# Patient Record
Sex: Male | Born: 1980 | Race: White | Hispanic: No | Marital: Married | Smoking: Never smoker
Health system: Southern US, Community
[De-identification: ages and names within clinical notes are randomized; demographics above are authoritative.]

## PROBLEM LIST (undated history)

## (undated) DIAGNOSIS — L723 Sebaceous cyst: Secondary | ICD-10-CM

## (undated) DIAGNOSIS — J029 Acute pharyngitis, unspecified: Secondary | ICD-10-CM

## (undated) DIAGNOSIS — R0981 Nasal congestion: Secondary | ICD-10-CM

## (undated) HISTORY — DX: Nasal congestion: R09.81

## (undated) HISTORY — DX: Sebaceous cyst: L72.3

## (undated) HISTORY — DX: Acute pharyngitis, unspecified: J02.9

---

## 2007-08-08 ENCOUNTER — Emergency Department (HOSPITAL_COMMUNITY): Admission: EM | Admit: 2007-08-08 | Discharge: 2007-08-08 | Payer: Self-pay | Admitting: Emergency Medicine

## 2007-08-16 ENCOUNTER — Encounter: Admission: RE | Admit: 2007-08-16 | Discharge: 2007-08-16 | Payer: Self-pay | Admitting: Family Medicine

## 2011-05-22 LAB — CBC
HCT: 41.9
Hemoglobin: 15.1
MCHC: 36
MCV: 88.3
Platelets: 292
RBC: 4.74
RDW: 11.6
WBC: 5.3

## 2011-05-22 LAB — BASIC METABOLIC PANEL
BUN: 12
CO2: 30
Calcium: 10
Chloride: 105
Creatinine, Ser: 0.74
GFR calc Af Amer: >60
GFR calc non Af Amer: >60
Glucose, Bld: 112 — ABNORMAL HIGH
Potassium: 4.2
Sodium: 141

## 2011-05-22 LAB — CLOSTRIDIUM DIFFICILE EIA

## 2011-05-22 LAB — OVA AND PARASITE EXAMINATION

## 2011-05-22 LAB — DIFFERENTIAL
Basophils Absolute: 0
Basophils Relative: 0
Eosinophils Absolute: 0.1 — ABNORMAL LOW
Eosinophils Relative: 1
Lymphocytes Relative: 32
Lymphs Abs: 1.7
Monocytes Absolute: 0.7
Monocytes Relative: 13 — ABNORMAL HIGH
Neutro Abs: 2.9
Neutrophils Relative %: 54

## 2011-05-22 LAB — STOOL CULTURE

## 2011-05-22 LAB — FECAL LACTOFERRIN, QUANT: Fecal Lactoferrin: NEGATIVE

## 2011-12-04 ENCOUNTER — Ambulatory Visit (INDEPENDENT_AMBULATORY_CARE_PROVIDER_SITE_OTHER): Payer: Self-pay | Admitting: General Surgery

## 2011-12-21 ENCOUNTER — Ambulatory Visit (INDEPENDENT_AMBULATORY_CARE_PROVIDER_SITE_OTHER): Payer: BC Managed Care – PPO | Admitting: General Surgery

## 2011-12-21 ENCOUNTER — Encounter (INDEPENDENT_AMBULATORY_CARE_PROVIDER_SITE_OTHER): Payer: Self-pay | Admitting: General Surgery

## 2011-12-21 VITALS — BP 136/82 | HR 70 | Temp 97.6°F | Resp 16 | Ht 68.0 in | Wt 179.5 lb

## 2011-12-21 DIAGNOSIS — L723 Sebaceous cyst: Secondary | ICD-10-CM

## 2011-12-21 NOTE — Progress Notes (Signed)
Patient ID: Anthony Mccullough, male   DOB: 1981-05-31, 31 y.o.   MRN: [id number]  Chief Complaint  Patient presents with  . Cyst    Sebaceous - on neck    HPI Anthony Mccullough is a 31 y.o. male.   HPI 8 yom with a 6 year history of an asymptomatic posterior neck mass.  He states he is here because his wife made him come look at it.  He has never had a history of infection, drainage or any other problems.    Past Medical History  Diagnosis Date  . Sebaceous cyst     back of neck  . Nasal congestion   . Sore throat     History reviewed. No pertinent past surgical history.  Family History  Problem Relation Age of Onset  . Cancer Maternal Grandfather     prostate    Social History History  Substance Use Topics  . Smoking status: Never Smoker   . Smokeless tobacco: Never Used  . Alcohol Use: Yes     1 or 2 glasses of wine per night    Allergies  Allergen Reactions  . Penicillins Rash    No current outpatient prescriptions on file.    Review of Systems Review of Systems  Constitutional: Negative for fever, chills and unexpected weight change.  HENT: Positive for congestion and sore throat. Negative for hearing loss, trouble swallowing and voice change.   Eyes: Negative for visual disturbance.  Respiratory: Negative for cough and wheezing.   Cardiovascular: Negative for chest pain, palpitations and leg swelling.  Gastrointestinal: Negative for nausea, vomiting, abdominal pain, diarrhea, constipation, blood in stool, abdominal distention, anal bleeding and rectal pain.  Genitourinary: Negative for hematuria and difficulty urinating.  Musculoskeletal: Negative for arthralgias.  Skin: Negative for rash and wound.  Neurological: Negative for seizures, syncope, weakness and headaches.  Hematological: Negative for adenopathy. Does not bruise/bleed easily.  Psychiatric/Behavioral: Negative for confusion.    Blood pressure 136/82, pulse 70, temperature 97.6 F (36.4 C),  temperature source Temporal, resp. rate 16, height 5\' 8"  (1.727 m), weight 179 lb 8 oz (81.421 kg).  Physical Exam Physical Exam  Vitals reviewed. Constitutional: He appears well-developed and well-nourished.  Neck:       Assessment    Posterior neck sebaceous cyst     Plan    We discussed observation vs removing this.  This certainly looks like a sebaceous cyst and has never had any symptoms.  He is not sure what he wants to do right now.  He will call back when he decides.  We discussed excising this as well as risks and benefits.        Anthony Mccullough 12/21/2011, 4:00 PM

## 2012-03-04 ENCOUNTER — Inpatient Hospital Stay (HOSPITAL_COMMUNITY)
Admission: EM | Admit: 2012-03-04 | Discharge: 2012-03-05 | DRG: 883 | Disposition: A | Discharge status: Home / Self Care | Payer: BC Managed Care – PPO | Attending: General Surgery | Admitting: General Surgery

## 2012-03-04 ENCOUNTER — Emergency Department (HOSPITAL_BASED_OUTPATIENT_CLINIC_OR_DEPARTMENT_OTHER): Payer: BC Managed Care – PPO

## 2012-03-04 ENCOUNTER — Encounter (HOSPITAL_BASED_OUTPATIENT_CLINIC_OR_DEPARTMENT_OTHER): Payer: Self-pay | Admitting: *Deleted

## 2012-03-04 ENCOUNTER — Encounter (HOSPITAL_COMMUNITY): Payer: Self-pay | Admitting: Anesthesiology

## 2012-03-04 ENCOUNTER — Emergency Department (HOSPITAL_COMMUNITY): Payer: BC Managed Care – PPO | Admitting: Anesthesiology

## 2012-03-04 ENCOUNTER — Encounter (HOSPITAL_COMMUNITY): Payer: Self-pay | Admitting: Emergency Medicine

## 2012-03-04 ENCOUNTER — Encounter (HOSPITAL_COMMUNITY): Admission: EM | Disposition: A | Discharge status: Home / Self Care | Payer: Self-pay | Source: Home / Self Care

## 2012-03-04 ENCOUNTER — Emergency Department (HOSPITAL_BASED_OUTPATIENT_CLINIC_OR_DEPARTMENT_OTHER)
Admission: EM | Admit: 2012-03-04 | Discharge: 2012-03-04 | Disposition: A | Discharge status: Home / Self Care | Payer: BC Managed Care – PPO | Source: Home / Self Care | Attending: Emergency Medicine | Admitting: Emergency Medicine

## 2012-03-04 DIAGNOSIS — K3580 Unspecified acute appendicitis: Secondary | ICD-10-CM

## 2012-03-04 DIAGNOSIS — K37 Unspecified appendicitis: Secondary | ICD-10-CM

## 2012-03-04 HISTORY — PX: LAPAROSCOPIC APPENDECTOMY: SHX408

## 2012-03-04 LAB — COMPREHENSIVE METABOLIC PANEL
BUN: 9 mg/dL (ref 6–23)
Calcium: 9.8 mg/dL (ref 8.4–10.5)
Chloride: 103 mEq/L (ref 96–112)
Creatinine, Ser: 0.7 mg/dL (ref 0.50–1.35)
GFR calc Af Amer: >90 mL/min (ref >90)
GFR calc non Af Amer: >90 mL/min (ref >90)
Sodium: 141 mEq/L (ref 135–145)
Total Bilirubin: 2.3 mg/dL — ABNORMAL HIGH (ref 0.3–1.2)
Total Protein: 7.6 g/dL (ref 6.0–8.3)

## 2012-03-04 LAB — CBC WITH DIFFERENTIAL/PLATELET
Eosinophils Relative: 0 % (ref 0–5)
Hemoglobin: 15.5 g/dL (ref 13.0–17.0)
Lymphocytes Relative: 17 % (ref 12–46)
Lymphs Abs: 1.5 10*3/uL (ref 0.7–4.0)
MCHC: 36.6 g/dL — ABNORMAL HIGH (ref 30.0–36.0)
Monocytes Absolute: 0.6 10*3/uL (ref 0.1–1.0)
Platelets: 203 10*3/uL (ref 150–400)
RBC: 4.96 MIL/uL (ref 4.22–5.81)
RDW: 11.7 % (ref 11.5–15.5)
WBC: 8.5 10*3/uL (ref 4.0–10.5)

## 2012-03-04 LAB — URINALYSIS, ROUTINE W REFLEX MICROSCOPIC
Glucose, UA: NEGATIVE mg/dL
Ketones, ur: NEGATIVE mg/dL
Leukocytes, UA: NEGATIVE
Nitrite: NEGATIVE
Protein, ur: NEGATIVE mg/dL
Specific Gravity, Urine: 1.015 (ref 1.005–1.030)
pH: 6.5 (ref 5.0–8.0)

## 2012-03-04 SURGERY — APPENDECTOMY, LAPAROSCOPIC
Anesthesia: General | Site: Abdomen | Wound class: Contaminated

## 2012-03-04 MED ORDER — ONDANSETRON HCL 4 MG/2ML IJ SOLN: 4.0000 mg | Freq: Once | INTRAMUSCULAR | Status: DC

## 2012-03-04 MED ORDER — DIPHENHYDRAMINE HCL 50 MG/ML IJ SOLN: 12.5000 mg | Freq: Four times a day (QID) | INTRAMUSCULAR | Status: DC | PRN

## 2012-03-04 MED ORDER — ONDANSETRON HCL 4 MG/2ML IJ SOLN
INTRAMUSCULAR | Status: DC | PRN
  Administered 2012-03-04: 4 mg via INTRAVENOUS

## 2012-03-04 MED ORDER — BUPIVACAINE-EPINEPHRINE 0.25% -1:200000 IJ SOLN
INTRAMUSCULAR | Status: AC
  Filled 2012-03-04: qty 1

## 2012-03-04 MED ORDER — LIDOCAINE HCL (CARDIAC) 20 MG/ML IV SOLN
INTRAVENOUS | Status: DC | PRN
  Administered 2012-03-04: 100 mg via INTRAVENOUS

## 2012-03-04 MED ORDER — NAPROXEN 500 MG PO TABS: 500.0000 mg | ORAL_TABLET | Freq: Two times a day (BID) | ORAL | Status: DC

## 2012-03-04 MED ORDER — NEOSTIGMINE METHYLSULFATE 1 MG/ML IJ SOLN
INTRAMUSCULAR | Status: DC | PRN
  Administered 2012-03-04: 4 mg via INTRAVENOUS

## 2012-03-04 MED ORDER — CIPROFLOXACIN IN D5W 400 MG/200ML IV SOLN
400.0000 mg | Freq: Two times a day (BID) | INTRAVENOUS | Status: DC
  Filled 2012-03-04: qty 200

## 2012-03-04 MED ORDER — CISATRACURIUM BESYLATE (PF) 10 MG/5ML IV SOLN
INTRAVENOUS | Status: DC | PRN
  Administered 2012-03-04: 4 mg via INTRAVENOUS

## 2012-03-04 MED ORDER — FENTANYL CITRATE 0.05 MG/ML IJ SOLN
INTRAMUSCULAR | Status: DC | PRN
  Administered 2012-03-04: 50 ug via INTRAVENOUS
  Administered 2012-03-04: 150 ug via INTRAVENOUS

## 2012-03-04 MED ORDER — PROMETHAZINE HCL 25 MG/ML IJ SOLN
12.5000 mg | Freq: Four times a day (QID) | INTRAMUSCULAR | Status: DC | PRN
  Filled 2012-03-04: qty 1

## 2012-03-04 MED ORDER — LACTATED RINGERS IV SOLN
INTRAVENOUS | Status: DC | PRN
  Administered 2012-03-04 (×2): via INTRAVENOUS

## 2012-03-04 MED ORDER — DEXAMETHASONE SODIUM PHOSPHATE 4 MG/ML IJ SOLN
INTRAMUSCULAR | Status: DC | PRN
  Administered 2012-03-04: 10 mg via INTRAVENOUS

## 2012-03-04 MED ORDER — ACETAMINOPHEN 325 MG PO TABS: 650.0000 mg | ORAL_TABLET | Freq: Four times a day (QID) | ORAL | Status: DC

## 2012-03-04 MED ORDER — GLYCOPYRROLATE 0.2 MG/ML IJ SOLN
INTRAMUSCULAR | Status: DC | PRN
  Administered 2012-03-04: .6 mg via INTRAVENOUS

## 2012-03-04 MED ORDER — KETOROLAC TROMETHAMINE 30 MG/ML IJ SOLN
INTRAMUSCULAR | Status: DC | PRN
  Administered 2012-03-04: 30 mg via INTRAVENOUS

## 2012-03-04 MED ORDER — METRONIDAZOLE IN NACL 5-0.79 MG/ML-% IV SOLN
INTRAVENOUS | Status: AC
  Filled 2012-03-04: qty 100

## 2012-03-04 MED ORDER — ONDANSETRON HCL 4 MG/2ML IJ SOLN: 4.0000 mg | Freq: Four times a day (QID) | INTRAMUSCULAR | Status: DC | PRN

## 2012-03-04 MED ORDER — ACETAMINOPHEN 10 MG/ML IV SOLN
INTRAVENOUS | Status: AC
  Filled 2012-03-04: qty 100

## 2012-03-04 MED ORDER — MAGIC MOUTHWASH
15.0000 mL | Freq: Four times a day (QID) | ORAL | Status: DC | PRN
  Filled 2012-03-04: qty 15

## 2012-03-04 MED ORDER — SUCCINYLCHOLINE CHLORIDE 20 MG/ML IJ SOLN
INTRAMUSCULAR | Status: DC | PRN
  Administered 2012-03-04: 80 mg via INTRAVENOUS

## 2012-03-04 MED ORDER — PROPOFOL 10 MG/ML IV EMUL
INTRAVENOUS | Status: DC | PRN
  Administered 2012-03-04: 100 mg via INTRAVENOUS
  Administered 2012-03-04: 180 mg via INTRAVENOUS

## 2012-03-04 MED ORDER — ACETAMINOPHEN 10 MG/ML IV SOLN
INTRAVENOUS | Status: DC | PRN
  Administered 2012-03-04: 1000 mg via INTRAVENOUS

## 2012-03-04 MED ORDER — CHLORHEXIDINE GLUCONATE 4 % EX LIQD
1.0000 "application " | Freq: Once | CUTANEOUS | Status: DC
  Filled 2012-03-04: qty 15

## 2012-03-04 MED ORDER — SODIUM CHLORIDE 0.9 % IV BOLUS (SEPSIS)
1000.0000 mL | Freq: Once | INTRAVENOUS | Status: AC
  Administered 2012-03-04: 1000 mL via INTRAVENOUS

## 2012-03-04 MED ORDER — LACTATED RINGERS IV BOLUS (SEPSIS): 1000.0000 mL | Freq: Three times a day (TID) | INTRAVENOUS | Status: DC | PRN

## 2012-03-04 MED ORDER — CIPROFLOXACIN IN D5W 400 MG/200ML IV SOLN
INTRAVENOUS | Status: DC | PRN
  Administered 2012-03-04: 400 mg via INTRAVENOUS

## 2012-03-04 MED ORDER — LACTATED RINGERS IR SOLN
Status: DC | PRN
  Administered 2012-03-04: 3000 mL

## 2012-03-04 MED ORDER — CHLORHEXIDINE GLUCONATE 4 % EX LIQD: 1.0000 "application " | Freq: Once | CUTANEOUS | Status: DC

## 2012-03-04 MED ORDER — METRONIDAZOLE IN NACL 5-0.79 MG/ML-% IV SOLN
500.0000 mg | Freq: Four times a day (QID) | INTRAVENOUS | Status: DC
  Administered 2012-03-04: .5 g via INTRAVENOUS

## 2012-03-04 MED ORDER — BUPIVACAINE-EPINEPHRINE 0.25% -1:200000 IJ SOLN
INTRAMUSCULAR | Status: DC | PRN
  Administered 2012-03-04: 10 mL
  Administered 2012-03-04: 50 mL

## 2012-03-04 MED ORDER — HYDROMORPHONE HCL PF 1 MG/ML IJ SOLN
0.5000 mg | INTRAMUSCULAR | Status: DC | PRN
  Filled 2012-03-04: qty 1

## 2012-03-04 MED ORDER — HYDROMORPHONE HCL PF 1 MG/ML IJ SOLN: 1.0000 mg | Freq: Once | INTRAMUSCULAR | Status: DC

## 2012-03-04 MED ORDER — ALUM & MAG HYDROXIDE-SIMETH 200-200-20 MG/5ML PO SUSP: 30.0000 mL | Freq: Four times a day (QID) | ORAL | Status: DC | PRN

## 2012-03-04 MED ORDER — CIPROFLOXACIN IN D5W 400 MG/200ML IV SOLN
INTRAVENOUS | Status: AC
  Filled 2012-03-04: qty 200

## 2012-03-04 MED ORDER — 0.9 % SODIUM CHLORIDE (POUR BTL) OPTIME
TOPICAL | Status: DC | PRN
  Administered 2012-03-04: 1000 mL

## 2012-03-04 SURGICAL SUPPLY — 45 items
APPLIER CLIP 5 13 M/L LIGAMAX5 (MISCELLANEOUS)
APPLIER CLIP ROT 10 11.4 M/L (STAPLE)
CANISTER SUCTION 2500CC (MISCELLANEOUS) ×2 IMPLANT
CLIP APPLIE 5 13 M/L LIGAMAX5 (MISCELLANEOUS) IMPLANT
CLIP APPLIE ROT 10 11.4 M/L (STAPLE) IMPLANT
CLOTH BEACON ORANGE TIMEOUT ST (SAFETY) ×2 IMPLANT
CUTTER FLEX LINEAR 45M (STAPLE) ×2 IMPLANT
DECANTER SPIKE VIAL GLASS SM (MISCELLANEOUS) ×2 IMPLANT
DRAPE LAPAROSCOPIC ABDOMINAL (DRAPES) ×2 IMPLANT
DRSG TEGADERM 2-3/8X2-3/4 SM (GAUZE/BANDAGES/DRESSINGS) ×4 IMPLANT
DRSG TEGADERM 4X4.75 (GAUZE/BANDAGES/DRESSINGS) ×2 IMPLANT
ELECT REM PT RETURN 9FT ADLT (ELECTROSURGICAL) ×2
ELECTRODE REM PT RTRN 9FT ADLT (ELECTROSURGICAL) ×1 IMPLANT
ENDOLOOP SUT PDS II  0 18 (SUTURE) ×1
ENDOLOOP SUT PDS II 0 18 (SUTURE) ×1 IMPLANT
GAUZE SPONGE 2X2 8PLY STRL LF (GAUZE/BANDAGES/DRESSINGS) ×1 IMPLANT
GLOVE BIO SURGEON STRL SZ8.5 (GLOVE) ×2 IMPLANT
GLOVE BIOGEL PI IND STRL 7.0 (GLOVE) ×2 IMPLANT
GLOVE BIOGEL PI IND STRL 8.5 (GLOVE) ×1 IMPLANT
GLOVE BIOGEL PI INDICATOR 7.0 (GLOVE) ×2
GLOVE BIOGEL PI INDICATOR 8.5 (GLOVE) ×1
GLOVE ECLIPSE 8.0 STRL XLNG CF (GLOVE) ×2 IMPLANT
GLOVE INDICATOR 8.0 STRL GRN (GLOVE) ×4 IMPLANT
GLOVE SURG SS PI 6.5 STRL IVOR (GLOVE) ×2 IMPLANT
GOWN STRL NON-REIN LRG LVL3 (GOWN DISPOSABLE) ×2 IMPLANT
GOWN STRL REIN XL XLG (GOWN DISPOSABLE) ×4 IMPLANT
KIT BASIN OR (CUSTOM PROCEDURE TRAY) ×2 IMPLANT
NS IRRIG 1000ML POUR BTL (IV SOLUTION) ×2 IMPLANT
PENCIL BUTTON HOLSTER BLD 10FT (ELECTRODE) IMPLANT
POUCH SPECIMEN RETRIEVAL 10MM (ENDOMECHANICALS) ×2 IMPLANT
RELOAD 45 VASCULAR/THIN (ENDOMECHANICALS) IMPLANT
RELOAD STAPLE TA45 3.5 REG BLU (ENDOMECHANICALS) ×2 IMPLANT
SCISSORS LAP 5X35 DISP (ENDOMECHANICALS) IMPLANT
SET IRRIG TUBING LAPAROSCOPIC (IRRIGATION / IRRIGATOR) ×2 IMPLANT
SOLUTION ANTI FOG 6CC (MISCELLANEOUS) IMPLANT
SPONGE GAUZE 2X2 STER 10/PKG (GAUZE/BANDAGES/DRESSINGS) ×1
SUT MNCRL AB 4-0 PS2 18 (SUTURE) ×2 IMPLANT
TOWEL OR 17X26 10 PK STRL BLUE (TOWEL DISPOSABLE) ×2 IMPLANT
TRAY FOLEY CATH 14FRSI W/METER (CATHETERS) ×2 IMPLANT
TRAY LAP CHOLE (CUSTOM PROCEDURE TRAY) ×2 IMPLANT
TROCAR XCEL BLADELESS 5X75MML (TROCAR) ×2 IMPLANT
TROCAR Z-THREAD FIOS 12X100MM (TROCAR) ×2 IMPLANT
TROCAR Z-THREAD FIOS 5X100MM (TROCAR) ×2 IMPLANT
TUBING INSUFFLATION 10FT LAP (TUBING) ×2 IMPLANT
WATER STERILE IRR 1500ML POUR (IV SOLUTION) ×2 IMPLANT

## 2012-03-04 NOTE — ED Provider Notes (Signed)
History     CSN: [id number]  Arrival date & time 03/04/12  1643   First MD Initiated Contact with Patient 03/04/12 1706      Chief Complaint  Patient presents with  . Abdominal Pain    (Consider location/radiation/quality/duration/timing/severity/associated sxs/prior treatment) HPI Patient complaining of ruq pain radiating to rlq with nausea beginning on awakneing today.  Pain ruq dull with sahrp aspect rlq.  5/10 and 7/10.  Increases with palpation and positiong.  Ate bread and smoothing with worsening sy;mptom.  No meds taken.   Past Medical History  Diagnosis Date  . Sebaceous cyst     back of neck  . Nasal congestion   . Sore throat     History reviewed. No pertinent past surgical history.  Family History  Problem Relation Age of Onset  . Cancer Maternal Grandfather     prostate    History  Substance Use Topics  . Smoking status: Never Smoker   . Smokeless tobacco: Never Used  . Alcohol Use: Yes     1 or 2 glasses of wine per night      Review of Systems  Genitourinary: Negative for dysuria, urgency, frequency, decreased urine volume and difficulty urinating.  All other systems reviewed and are negative.  Patient is Jehovah's Witness- no blood  Allergies  Penicillins  Home Medications  No current outpatient prescriptions on file.  BP 137/97  Pulse 89  Temp 98.1 F (36.7 C) (Oral)  Resp 20  SpO2 99%  Physical Exam  Nursing note and vitals reviewed. Constitutional: He is oriented to person, place, and time. He appears well-developed and well-nourished.  HENT:  Head: Normocephalic and atraumatic.  Eyes: Conjunctivae are normal. Pupils are equal, round, and reactive to light.  Neck: Normal range of motion. Neck supple.  Cardiovascular: Normal rate, regular rhythm and normal heart sounds.   Pulmonary/Chest: Effort normal.  Abdominal: Soft. Bowel sounds are normal. He exhibits no mass. There is tenderness. There is no guarding.       Tender rlq    Musculoskeletal: Normal range of motion.  Neurological: He is alert and oriented to person, place, and time.  Skin: Skin is warm and dry.  Psychiatric: He has a normal mood and affect.    ED Course  Procedures (including critical care time)   Labs Reviewed  URINALYSIS, ROUTINE W REFLEX MICROSCOPIC   No results found.   No diagnosis found. Results for orders placed during the hospital encounter of 03/04/12  URINALYSIS, ROUTINE W REFLEX MICROSCOPIC      Component Value Range   Color, Urine YELLOW  YELLOW   APPearance CLEAR  CLEAR   Specific Gravity, Urine 1.015  1.005 - 1.030   pH 6.5  5.0 - 8.0   Glucose, UA NEGATIVE  NEGATIVE mg/dL   Hgb urine dipstick NEGATIVE  NEGATIVE   Bilirubin Urine NEGATIVE  NEGATIVE   Ketones, ur NEGATIVE  NEGATIVE mg/dL   Protein, ur NEGATIVE  NEGATIVE mg/dL   Urobilinogen, UA 0.2  0.0 - 1.0 mg/dL   Nitrite NEGATIVE  NEGATIVE   Leukocytes, UA NEGATIVE  NEGATIVE  CBC WITH DIFFERENTIAL      Component Value Range   WBC 8.5  4.0 - 10.5 K/uL   RBC 4.96  4.22 - 5.81 MIL/uL   Hemoglobin 15.5  13.0 - 17.0 g/dL   HCT 40.9  81.1 - 91.4 %   MCV 85.5  78.0 - 100.0 fL   MCH 31.3  26.0 - 34.0 pg  MCHC 36.6 (*) 30.0 - 36.0 g/dL   RDW 45.4  09.8 - 11.9 %   Platelets 203  150 - 400 K/uL   Neutrophils Relative 76  43 - 77 %   Neutro Abs 6.5  1.7 - 7.7 K/uL   Lymphocytes Relative 17  12 - 46 %   Lymphs Abs 1.5  0.7 - 4.0 K/uL   Monocytes Relative 7  3 - 12 %   Monocytes Absolute 0.6  0.1 - 1.0 K/uL   Eosinophils Relative 0  0 - 5 %   Eosinophils Absolute 0.0  0.0 - 0.7 K/uL   Basophils Relative 0  0 - 1 %   Basophils Absolute 0.0  0.0 - 0.1 K/uL  COMPREHENSIVE METABOLIC PANEL      Component Value Range   Sodium 141  135 - 145 mEq/L   Potassium 3.7  3.5 - 5.1 mEq/L   Chloride 103  96 - 112 mEq/L   CO2 27  19 - 32 mEq/L   Glucose, Bld 102 (*) 70 - 99 mg/dL   BUN 9  6 - 23 mg/dL   Creatinine, Ser 1.47  0.50 - 1.35 mg/dL   Calcium 9.8  8.4 - 82.9  mg/dL   Total Protein 7.6  6.0 - 8.3 g/dL   Albumin 4.9  3.5 - 5.2 g/dL   AST 18  0 - 37 U/L   ALT 32  0 - 53 U/L   Alkaline Phosphatase 52  39 - 117 U/L   Total Bilirubin 2.3 (*) 0.3 - 1.2 mg/dL   GFR calc non Af Amer >90  >90 mL/min   GFR calc Af Amer >90  >90 mL/min   No information on file. Ct Abdomen Pelvis Wo Contrast  03/04/2012  *RADIOLOGY REPORT*  Clinical Data: The right lower quadrant pain and nausea.  CT ABDOMEN AND PELVIS WITHOUT CONTRAST  Technique:  Multidetector CT imaging of the abdomen and pelvis was performed following the standard protocol without intravenous contrast.  Comparison: 08/16/2007  Findings: Calcified granuloma at the right lung base and calcified granulomas in the spleen.  Osseous structures are normal. Liver, bile ducts, pancreas, adrenal glands, and kidneys are normal.  The appendix is slightly more prominent than on the prior study with a diameter of 8 mm but there is no periappendiceal inflammation.  Terminal ileum is normal.  The remainder of the bowel is normal.  No osseous abnormality.  IMPRESSION: There is slight increased prominence of the appendix since the prior exam to a diameter of 8 mm.  No discrete periappendiceal inflammation.  The possibility of early appendicitis should be considered.  The remainder of the exam is normal.  Original Report Authenticated By: Gwynn Burly, M.D.     MDM  CT reviewed and Dr. gross consult said. I discussed the results of the CT with the patient and his family. He will go to [facility] emergency department for evaluation by Dr. gross.       Hilario Quarry, MD 03/04/12 [phone]

## 2012-03-04 NOTE — H&P (Signed)
Anthony Mccullough  1981-08-12 [id number]  CARE TEAM:  PCP: Emeterio Reeve, MD  Outpatient Care Team: Patient Care Team: Emeterio Reeve, MD as PCP - General (Family Medicine)  Inpatient Treatment Team: Treatment Team: Attending Provider: Ardeth Sportsman, MD; Consulting Physician: Bishop Limbo, MD; Technician: Heloise Ochoa, EMT; Registered Nurse: Doree Albee, RN   This patient is a 31 y.o.male who presents today for surgical evaluation at the request of Dr. Rosalia Hammers.   Reason for evaluation: Probable appendicitis  Patient is a healthy male.  Jehovah's Witness.  White.  No history of prior GI problems.  I will this morning with nausea and decreased appetite.  Upper abdominal pain somewhat right-sided.  Has gradually progressed down towards the right lower quadrant.  Become more intense.  Has not tolerated much more than a few bites of bread today.  He does the pain worse and his wife and he went to urgent care.  He was sent to the emergency room [facility].    CT scan was concerning for an abnormal appendix.  The rest of the differential diagnosis seems unlikely.  Dr. Rosalia Hammers requested surgical evaluation for probable appendicitis.  He was sent to [facility] for surgical consultation.  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  He has not had any vomiting or diarrhea.  No low-grade fevers.  No sick contacts.  No travel history.  He's never had anything like this before.  No history of inflammatory bowel disease or Crohn's.  No hematuria.  No history of urinary tract infections or kidney stones.  Patient Active Problem List  Diagnosis  . Cyst of skin and subcutaneous tissue  . Acute appendicitis    Past Medical History  Diagnosis Date  . Sebaceous cyst     back of neck  . Nasal congestion   . Sore throat     History reviewed. No pertinent past surgical history.  History   Social History  . Marital Status: Married    Spouse Name: N/A    Number of Children: N/A  .  Years of Education: N/A   Occupational History  . Not on file.   Social History Main Topics  . Smoking status: Never Smoker   . Smokeless tobacco: Never Used  . Alcohol Use: Yes     1 or 2 glasses of wine per night  . Drug Use: No  . Sexually Active: Not on file   Other Topics Concern  . Not on file   Social History Narrative  . No narrative on file    Family History  Problem Relation Age of Onset  . Cancer Maternal Grandfather     prostate    Current Facility-Administered Medications  Medication Dose Route Frequency Provider Last Rate Last Dose  . acetaminophen (TYLENOL) tablet 650 mg  650 mg Oral QID Ardeth Sportsman, MD      . alum & mag hydroxide-simeth (MAALOX/MYLANTA) 200-200-20 MG/5ML suspension 30 mL  30 mL Oral Q6H PRN Ardeth Sportsman, MD      . chlorhexidine (HIBICLENS) 4 % liquid 1 application  1 application Topical Once Ardeth Sportsman, MD      . ciprofloxacin (CIPRO) IVPB 400 mg  400 mg Intravenous Q12H Ardeth Sportsman, MD      . diphenhydrAMINE (BENADRYL) injection 12.5-25 mg  12.5-25 mg Intravenous Q6H PRN Ardeth Sportsman, MD      . HYDROmorphone (DILAUDID) injection 0.5-2 mg  0.5-2 mg  Intravenous Q30 min PRN Ardeth Sportsman, MD      . lactated ringers bolus 1,000 mL  1,000 mL Intravenous Q8H PRN Ardeth Sportsman, MD      . magic mouthwash  15 mL Oral QID PRN Ardeth Sportsman, MD      . metroNIDAZOLE (FLAGYL) IVPB 500 mg  500 mg Intravenous Q6H Ardeth Sportsman, MD      . naproxen (NAPROSYN) tablet 500 mg  500 mg Oral BID WC Ardeth Sportsman, MD      . ondansetron [facility]) injection 4-8 mg  4-8 mg Intravenous Q6H PRN Ardeth Sportsman, MD      . promethazine (PHENERGAN) injection 12.5-25 mg  12.5-25 mg Intravenous Q6H PRN Ardeth Sportsman, MD      . DISCONTD: chlorhexidine (HIBICLENS) 4 % liquid 1 application  1 application Topical Once Ardeth Sportsman, MD       Current Outpatient Prescriptions  Medication Sig Dispense Refill  . ibuprofen (ADVIL,MOTRIN) 200 MG  tablet Take 200 mg by mouth every 6 (six) hours as needed. For pain.      Marland Kitchen loratadine (CLARITIN) 10 MG tablet Take 10 mg by mouth daily.       Facility-Administered Medications Ordered in Other Encounters  Medication Dose Route Frequency Provider Last Rate Last Dose  . sodium chloride 0.9 % bolus 1,000 mL  1,000 mL Intravenous Once Hilario Quarry, MD   1,000 mL at 03/04/12 1752  . DISCONTD: HYDROmorphone (DILAUDID) injection 1 mg  1 mg Intravenous Once Hilario Quarry, MD      . DISCONTD: ondansetron (ZOFRAN) injection 4 mg  4 mg Intravenous Once Hilario Quarry, MD         Allergies  Allergen Reactions  . Penicillins Rash    ROS: Constitutional:  No fevers, chills, sweats.  Weight stable Eyes:  No vision changes, No discharge HENT:  No sore throats, nasal drainage Lymph: No neck swelling, No bruising easily Pulmonary:  No cough, productive sputum CV: No orthopnea, PND  Patient walks 60 minutes for about 2 miles without difficulty.  No exertional chest/neck/shoulder/arm pain.  GI: No personal nor family history of GI/colon cancer, inflammatory bowel disease, irritable bowel syndrome, allergy such as Celiac Sprue, dietary/dairy problems, colitis, ulcers nor gastritis.    No recent sick contacts/gastroenteritis.  No travel outside the country.  No changes in diet.  Renal: No UTIs, No hematuria Genital:  No drainage, bleeding, masses Musculoskeletal: No severe joint pain.  Good ROM major joints Skin:  No sores or lesions.  No rashes Heme/Lymph:  No easy bleeding.  No swollen lymph nodes  BP 127/73  Pulse 79  Temp 98.3 F (36.8 C) (Oral)  Resp 16  SpO2 98%  Physical Exam: General: Pt awake/alert/oriented x4 in no major acute distress Eyes: PERRL, normal EOM. Sclera nonicteric Neuro: CN II-XII intact w/o focal sensory/motor deficits. Lymph: No head/neck/groin lymphadenopathy Psych:  No delerium/psychosis/paranoia HENT: Normocephalic, Mucus membranes moist.  No thrush Neck:  Supple, No tracheal deviation Chest: No pain.  Good respiratory excursion.  CTA bilaterally CV:  Pulses intact.  Regular rhythm Abdomen: Soft, Nondistended.  TTP RLQ @ McBurney's point & right suprapubic area.  +Rosvig/obturator sign.   Nontender.  No incarcerated hernias. Ext:  SCDs BLE.  No significant edema.  No cyanosis Skin: No petechiae / purpurae  Results:   Labs: Results for orders placed during the hospital encounter of 03/04/12 (from the past 48 hour(s))  URINALYSIS, ROUTINE W REFLEX  MICROSCOPIC     Status: Normal   Collection Time   03/04/12  4:50 PM      Component Value Range Comment   Color, Urine YELLOW  YELLOW    APPearance CLEAR  CLEAR    Specific Gravity, Urine 1.015  1.005 - 1.030    pH 6.5  5.0 - 8.0    Glucose, UA NEGATIVE  NEGATIVE mg/dL    Hgb urine dipstick NEGATIVE  NEGATIVE    Bilirubin Urine NEGATIVE  NEGATIVE    Ketones, ur NEGATIVE  NEGATIVE mg/dL    Protein, ur NEGATIVE  NEGATIVE mg/dL    Urobilinogen, UA 0.2  0.0 - 1.0 mg/dL    Nitrite NEGATIVE  NEGATIVE    Leukocytes, UA NEGATIVE  NEGATIVE MICROSCOPIC NOT DONE ON URINES WITH NEGATIVE PROTEIN, BLOOD, LEUKOCYTES, NITRITE, OR GLUCOSE <1000 mg/dL.  CBC WITH DIFFERENTIAL     Status: Abnormal   Collection Time   03/04/12  6:00 PM      Component Value Range Comment   WBC 8.5  4.0 - 10.5 K/uL    RBC 4.96  4.22 - 5.81 MIL/uL    Hemoglobin 15.5  13.0 - 17.0 g/dL    HCT 40.9  81.1 - 91.4 %    MCV 85.5  78.0 - 100.0 fL    MCH 31.3  26.0 - 34.0 pg    MCHC 36.6 (*) 30.0 - 36.0 g/dL    RDW 78.2  95.6 - 21.3 %    Platelets 203  150 - 400 K/uL    Neutrophils Relative 76  43 - 77 %    Neutro Abs 6.5  1.7 - 7.7 K/uL    Lymphocytes Relative 17  12 - 46 %    Lymphs Abs 1.5  0.7 - 4.0 K/uL    Monocytes Relative 7  3 - 12 %    Monocytes Absolute 0.6  0.1 - 1.0 K/uL    Eosinophils Relative 0  0 - 5 %    Eosinophils Absolute 0.0  0.0 - 0.7 K/uL    Basophils Relative 0  0 - 1 %    Basophils Absolute 0.0  0.0 - 0.1  K/uL   COMPREHENSIVE METABOLIC PANEL     Status: Abnormal   Collection Time   03/04/12  6:00 PM      Component Value Range Comment   Sodium 141  135 - 145 mEq/L    Potassium 3.7  3.5 - 5.1 mEq/L    Chloride 103  96 - 112 mEq/L    CO2 27  19 - 32 mEq/L    Glucose, Bld 102 (*) 70 - 99 mg/dL    BUN 9  6 - 23 mg/dL    Creatinine, Ser 0.86  0.50 - 1.35 mg/dL    Calcium 9.8  8.4 - 57.8 mg/dL    Total Protein 7.6  6.0 - 8.3 g/dL    Albumin 4.9  3.5 - 5.2 g/dL    AST 18  0 - 37 U/L    ALT 32  0 - 53 U/L    Alkaline Phosphatase 52  39 - 117 U/L    Total Bilirubin 2.3 (*) 0.3 - 1.2 mg/dL    GFR calc non Af Amer >90  >90 mL/min    GFR calc Af Amer >90  >90 mL/min     Imaging / Studies: Ct Abdomen Pelvis Wo Contrast  03/04/2012  *RADIOLOGY REPORT*  Clinical Data: The right lower quadrant pain and nausea.  CT  ABDOMEN AND PELVIS WITHOUT CONTRAST  Technique:  Multidetector CT imaging of the abdomen and pelvis was performed following the standard protocol without intravenous contrast.  Comparison: 08/16/2007  Findings: Calcified granuloma at the right lung base and calcified granulomas in the spleen.  Osseous structures are normal. Liver, bile ducts, pancreas, adrenal glands, and kidneys are normal.  The appendix is slightly more prominent than on the prior study with a diameter of 8 mm but there is no periappendiceal inflammation.  Terminal ileum is normal.  The remainder of the bowel is normal.  No osseous abnormality.  IMPRESSION: There is slight increased prominence of the appendix since the prior exam to a diameter of 8 mm.  No discrete periappendiceal inflammation.  The possibility of early appendicitis should be considered.  The remainder of the exam is normal.  Original Report Authenticated By: Gwynn Burly, M.D.    Medications / Allergies: per chart  Antibiotics: Anti-infectives     Start     Dose/Rate Route Frequency Ordered Stop   03/04/12 2200   metroNIDAZOLE (FLAGYL) IVPB 500 mg         500 mg 100 mL/hr over 60 Minutes Intravenous Every 6 hours 03/04/12 2112     03/04/12 2200   ciprofloxacin (CIPRO) IVPB 400 mg        400 mg 200 mL/hr over 60 Minutes Intravenous Every 12 hours 03/04/12 2112            Assessment  Bertis Ruddy  31 y.o. male     Procedure(s): APPENDECTOMY LAPAROSCOPIC  Problem List:  Active Problems:  Acute appendicitis  Appendicitis  Plan:  Lap appy:  The anatomy & physiology of the digestive tract was discussed.  The pathophysiology of appendicitis was discussed.  Natural history risks without surgery was discussed.   I feel the risks of no intervention will lead to serious problems that outweigh the operative risks; therefore, I recommended diagnostic laparoscopy with removal of appendix to remove the pathology.  Laparoscopic & open techniques were discussed.   I noted a good likelihood this will help address the problem.    Risks such as bleeding, infection, abscess, leak, reoperation, possible ostomy, hernia, heart attack, death, and other risks were discussed.  Goals of post-operative recovery were discussed as well.  We will work to minimize complications.  Questions were answered.  The patient expresses understanding & wishes to proceed with surgery.   -VTE prophylaxis- SCDs, etc -mobilize as tolerated to help recovery  Ardeth Sportsman, M.D., F.A.C.S. Gastrointestinal and Minimally Invasive Surgery [facility] [address] [place], [place] [address] [phone] Main / Paging [phone] Voice Mail   03/04/2012

## 2012-03-04 NOTE — ED Notes (Signed)
Family at bedside. 

## 2012-03-04 NOTE — Preoperative (Signed)
Beta Blockers   Reason not to administer Beta Blockers:Not Applicable 

## 2012-03-04 NOTE — ED Notes (Signed)
MD at bedside. Dr. Gross at bedside 

## 2012-03-04 NOTE — ED Notes (Signed)
Pt. Reports he does not want any zofran at this time.

## 2012-03-04 NOTE — ED Notes (Signed)
Dr. Rosalia Hammers informed of pt. Not wanting the zofran when ordered.

## 2012-03-04 NOTE — ED Notes (Addendum)
Pt reports awakening with dull, aching RUQ pain as well as RLQ sharp pain, tender to palpation. Also had nausea but no vomiting as well as one episode of diarrhea. Nausea is resolved at this time. Occur upon awakening. Has had toast and a smoothie to eat today. Denied Zofran and any pain medication.

## 2012-03-04 NOTE — ED Notes (Signed)
Dr. Rosalia Hammers gave voice order to leave IV in place for transport in Private Vehicle.  Pt. Has 20g in the L  Forearm.

## 2012-03-04 NOTE — ED Notes (Signed)
Pt sent from [facility] for acute appendicitis. Pt to be seen by Dr. Michaell Cowing. Pt arrives with IVHL in situ.

## 2012-03-04 NOTE — Transfer of Care (Signed)
Immediate Anesthesia Transfer of Care Note  Patient: Anthony Mccullough  Procedure(s) Performed: Procedure(s) (LRB): APPENDECTOMY LAPAROSCOPIC (N/A)  Patient Location: PACU  Anesthesia Type: General  Level of Consciousness: awake, alert  and oriented  Airway & Oxygen Therapy: Patient Spontanous Breathing and Patient connected to face mask oxygen  Post-op Assessment: Report given to PACU RN and Post -op Vital signs reviewed and stable  Post vital signs: Reviewed and stable  Complications: No apparent anesthesia complications

## 2012-03-04 NOTE — Op Note (Signed)
03/04/2012  11:38 PM  PATIENT:  Anthony Mccullough  31 y.o. male  Patient Care Team: Emeterio Reeve, MD as PCP - General (Family Medicine)  PRE-OPERATIVE DIAGNOSIS:  appendicitis  POST-OPERATIVE DIAGNOSIS:  Acute appendicitis  PROCEDURE:  Procedure(s): APPENDECTOMY LAPAROSCOPIC  SURGEON:  Surgeon(s): Ardeth Sportsman, MD  ASSISTANT: none   ANESTHESIA:   local and general  EBL:  Total I/O In: 1000 [I.V.:1000] Out: 175 [Urine:125; Blood:50]  Delay start of Pharmacological VTE agent (>24hrs) due to surgical blood loss or risk of bleeding:  no  DRAINS: none   SPECIMEN:  Source of Specimen:  Appendix  DISPOSITION OF SPECIMEN:  PATHOLOGY  COUNTS:  YES  PLAN OF CARE: Admit for overnight observation  PATIENT DISPOSITION:  PACU - hemodynamically stable.  INDICATIONS:  The anatomy & physiology of the digestive tract was discussed.  The pathophysiology of appendicitis was discussed.  Natural history risks without surgery was discussed.   I feel the risks of no intervention will lead to serious problems that outweigh the operative risks; therefore, I recommended diagnostic laparoscopy with removal of appendix to remove the pathology.  Laparoscopic & open techniques were discussed.   I noted a good likelihood this will help address the problem.    Risks such as bleeding, infection, abscess, leak, reoperation, possible ostomy, hernia, heart attack, death, and other risks were discussed.  Goals of post-operative recovery were discussed as well.  We will work to minimize complications.  Questions were answered.  The patient expresses understanding & wishes to proceed with surgery.  OR FINDINGS: He had a mildly inflamed / thickened appendix.  His colon small intestine and other organs appeared normal.  DESCRIPTION:   Informed consent was confirmed.  The patient underwent general anaesthesia without difficulty.  The patient was positioned appropriately.  VTE prevention in place.  The  patient's abdomen was clipped, prepped, & draped in a sterile fashion.  Surgical timeout confirmed our plan.  The patient was positioned in reverse Trendelenburg.  Abdominal entry was gained using optical entry technique in the LLQ abdomen.  Entry was clean.  I induced carbon dioxide insufflation.  Camera inspection revealed no injury.  Extra ports were carefully placed under direct laparoscopic visualization.  I mobilized the terminal ileum and cecum and proximal ascending colon in a lateral to medial fashion.  I took care to avoid injuring any retroperitoneal structures.   I freed the appendix off its attachments to the ascending colon and cecal mesentery.  I elevated the appendix. I skeletonized the mesoappendix. I was able to free off the base of the appendix which was still viable.  I stapled the appendix off the cecum using a laparoscopic stapler. I took a healthy cuff viable cecum.  I removed the appendix inside the 12 mm port.  I did copious irrigation. Hemostasis was good in the mesoappendix, colon mesentery, and retroperitoneum. Staple line was intact on the cecum with no bleeding. I washed out the pelvis, retrohepatic space and right paracolic gutter. I washed out the left side as well.  Hemostasis is good. There was no perforation or injury. The cecum and ascending colon was normal.  I ran the small intestine from equal ileocecal valve to the jejunum.  I saw no mesial appendix.  The terminal ileum is not thickened.  Normal small bowel.  The rectum is mildly dilated but otherwise fine as well as the sigmoid colon.  There is no evidence of any inguinal or other abdominal hernias  Because the area cleaned  up well after irrigation, I did not place a drain.  I closed the 12 mm port site fascial defect using a 0 Vicryl stitch with a laparoscopic suture passer under direct visualization.  I aspirated the carbon dioxide. I removed the ports.  I closed skin using 4-0 monocryl stitch.  Patient was  extubated and sent to the recovery room.  I discussed the operative findings with the patient's wife &  family. I suspect the patient is going used in the hospital at least overnight and will need antibiotics overnight. Questions answered. They expressed understanding and appreciation.

## 2012-03-04 NOTE — Anesthesia Preprocedure Evaluation (Signed)
Anesthesia Evaluation  Patient identified by MRN, date of birth, ID band Patient awake    Reviewed: Allergy & Precautions, H&P , NPO status , Patient's Chart, lab work & pertinent test results  Airway Mallampati: II TM Distance: >3 FB Neck ROM: full    Dental No notable dental hx. (+) Teeth Intact   Pulmonary neg pulmonary ROS,  breath sounds clear to auscultation  Pulmonary exam normal       Cardiovascular Exercise Tolerance: Good negative cardio ROS  Rhythm:regular Rate:Normal     Neuro/Psych negative neurological ROS  negative psych ROS   GI/Hepatic negative GI ROS, Neg liver ROS,   Endo/Other  negative endocrine ROS  Renal/GU negative Renal ROS  negative genitourinary   Musculoskeletal   Abdominal   Peds  Hematology negative hematology ROS (+)   Anesthesia Other Findings   Reproductive/Obstetrics negative OB ROS                           Anesthesia Physical Anesthesia Plan  ASA: I and Emergent  Anesthesia Plan: General   Post-op Pain Management:    Induction: Intravenous  Airway Management Planned: Oral ETT  Additional Equipment:   Intra-op Plan:   Post-operative Plan: Extubation in OR  Informed Consent: I have reviewed the patients History and Physical, chart, labs and discussed the procedure including the risks, benefits and alternatives for the proposed anesthesia with the patient or authorized representative who has indicated his/her understanding and acceptance.   Dental Advisory Given  Plan Discussed with: CRNA and Surgeon  Anesthesia Plan Comments:         Anesthesia Quick Evaluation

## 2012-03-04 NOTE — ED Notes (Signed)
Woke with pain in his right upper quad, nausea diarrhea. Pain has moved to his right lower quadrant.

## 2012-03-05 ENCOUNTER — Encounter (HOSPITAL_COMMUNITY): Payer: Self-pay | Admitting: *Deleted

## 2012-03-05 MED ORDER — HYDROMORPHONE HCL PF 1 MG/ML IJ SOLN
0.2500 mg | INTRAMUSCULAR | Status: DC | PRN
  Administered 2012-03-05: 0.5 mg via INTRAVENOUS

## 2012-03-05 MED ORDER — SODIUM CHLORIDE 0.9 % IJ SOLN: 3.0000 mL | INTRAMUSCULAR | Status: DC | PRN

## 2012-03-05 MED ORDER — LACTATED RINGERS IV SOLN: INTRAVENOUS | Status: DC

## 2012-03-05 MED ORDER — ACETAMINOPHEN 325 MG PO TABS: 650.0000 mg | ORAL_TABLET | Freq: Four times a day (QID) | ORAL | Status: DC | PRN

## 2012-03-05 MED ORDER — ALUM & MAG HYDROXIDE-SIMETH 200-200-20 MG/5ML PO SUSP: 30.0000 mL | Freq: Four times a day (QID) | ORAL | Status: DC | PRN

## 2012-03-05 MED ORDER — SODIUM CHLORIDE 0.9 % IJ SOLN: 3.0000 mL | Freq: Two times a day (BID) | INTRAMUSCULAR | Status: DC

## 2012-03-05 MED ORDER — OXYCODONE HCL 5 MG PO TABS
5.0000 mg | ORAL_TABLET | ORAL | Status: DC | PRN
  Administered 2012-03-05: 5 mg via ORAL
  Filled 2012-03-05: qty 1

## 2012-03-05 MED ORDER — OXYCODONE HCL 5 MG PO TABS: 5.0000 mg | ORAL_TABLET | ORAL | Status: AC | PRN

## 2012-03-05 MED ORDER — CIPROFLOXACIN IN D5W 400 MG/200ML IV SOLN
400.0000 mg | Freq: Two times a day (BID) | INTRAVENOUS | Status: DC
  Administered 2012-03-05: 400 mg via INTRAVENOUS
  Filled 2012-03-05 (×2): qty 200

## 2012-03-05 MED ORDER — NAPROXEN 500 MG PO TABS
500.0000 mg | ORAL_TABLET | Freq: Two times a day (BID) | ORAL | Status: DC
  Administered 2012-03-05 (×2): 500 mg via ORAL
  Filled 2012-03-05 (×3): qty 1

## 2012-03-05 MED ORDER — METRONIDAZOLE IN NACL 5-0.79 MG/ML-% IV SOLN
500.0000 mg | Freq: Four times a day (QID) | INTRAVENOUS | Status: DC
  Administered 2012-03-05 (×2): 500 mg via INTRAVENOUS
  Filled 2012-03-05 (×4): qty 100

## 2012-03-05 MED ORDER — SODIUM CHLORIDE 0.9 % IV SOLN: 250.0000 mL | INTRAVENOUS | Status: DC | PRN

## 2012-03-05 MED ORDER — PSYLLIUM 95 % PO PACK
1.0000 | PACK | Freq: Two times a day (BID) | ORAL | Status: DC
  Administered 2012-03-05 (×2): 1 via ORAL
  Filled 2012-03-05 (×4): qty 1

## 2012-03-05 MED ORDER — LIP MEDEX EX OINT: 1.0000 "application " | TOPICAL_OINTMENT | Freq: Two times a day (BID) | CUTANEOUS | Status: DC

## 2012-03-05 MED ORDER — MAGNESIUM HYDROXIDE 400 MG/5ML PO SUSP: 30.0000 mL | Freq: Two times a day (BID) | ORAL | Status: DC | PRN

## 2012-03-05 MED ORDER — HYDROMORPHONE HCL PF 1 MG/ML IJ SOLN
INTRAMUSCULAR | Status: AC
  Filled 2012-03-05: qty 1

## 2012-03-05 MED ORDER — LACTATED RINGERS IV BOLUS (SEPSIS)
1000.0000 mL | Freq: Three times a day (TID) | INTRAVENOUS | Status: DC | PRN
Start: 2012-03-05 — End: 2012-03-05

## 2012-03-05 MED ORDER — LORATADINE 10 MG PO TABS
10.0000 mg | ORAL_TABLET | Freq: Every day | ORAL | Status: DC
  Filled 2012-03-05: qty 1

## 2012-03-05 MED ORDER — MAGIC MOUTHWASH
15.0000 mL | Freq: Four times a day (QID) | ORAL | Status: DC | PRN
  Filled 2012-03-05: qty 15

## 2012-03-05 NOTE — Progress Notes (Signed)
1715 pt has tolerated diet, is passing flatus, voiding without difficulty and ambulating. Discharged per order via wheelchair accompanied by wife.

## 2012-03-05 NOTE — Progress Notes (Signed)
Report to evelyn rn

## 2012-03-05 NOTE — Progress Notes (Signed)
Anthony Mccullough [id number] 06-28-1981  CARE TEAM:  PCP: Emeterio Reeve, MD  Outpatient Care Team: Patient Care Team: Emeterio Reeve, MD as PCP - General (Family Medicine)  Inpatient Treatment Team: Treatment Team: Attending Provider: Bishop Limbo, MD; Consulting Physician: Md Montez Morita, MD; Technician: Mal Misty, NT  Subjective:  Feeling better RLQ pain fading away.  Mild soreness at lap port sites Walking in hallways Tol breakfast  Objective:  Vital signs:  Filed Vitals:   03/05/12 0250 03/05/12 0350 03/05/12 0613 03/05/12 0953  BP: 115/72 102/63 114/62 120/70  Pulse: 91 83 94 76  Temp: 98.3 F (36.8 C) 97.9 F (36.6 C) 97.7 F (36.5 C) 98.9 F (37.2 C)  TempSrc: Oral Oral Oral Oral  Resp: 16 18 16 16   Height:      Weight:      SpO2: 99% 99% 98% 96%    Last BM Date: 03/04/12  Intake/Output   Yesterday:  07/19 0701 - 07/20 0700 In: 1593.8 [P.O.:120; I.V.:1473.8] Out: 575 [Urine:525; Blood:50] This shift:  Total I/O In: -  Out: 1200 [Urine:1200]  Bowel function:  Flatus: n  BM: n  Physical Exam:  General: Pt awake/alert/oriented x4 in no acute distress.  Walking in hallway Eyes: PERRL, normal EOM.  Sclera clear.  No icterus Neuro: CN II-XII intact w/o focal sensory/motor deficits. Lymph: No head/neck/groin lymphadenopathy Psych:  No delerium/psychosis/paranoia HENT: Normocephalic, Mucus membranes moist.  No thrush Neck: Supple, No tracheal deviation Chest: No chest wall pain w good excursion CV:  Pulses intact.  Regular rhythm Abdomen: Soft.  Nondistended.  Mildly tender at incisions only.  No incarcerated hernias. Ext:  SCDs BLE.  No mjr edema.  No cyanosis Skin: No petechiae / purpurae  Problem List:  Active Problems:  Acute appendicitis   Assessment  Anthony Mccullough  31 y.o. male  1 Day Post-Op  Procedure(s): APPENDECTOMY LAPAROSCOPIC  Recovering  Plan:  -stop IVF -adv diet -prob d/c later today if he continues to improve  -VTE  prophylaxis- SCDs, etc -mobilize as tolerated to help recovery  Ardeth Sportsman, M.D., F.A.C.S. Gastrointestinal and Minimally Invasive Surgery [facility] [address], [address] [place], [place] [address] [phone] Main / Paging [phone] Voice Mail   03/05/2012  Results:   Labs: Results for orders placed during the hospital encounter of 03/04/12 (from the past 48 hour(s))  URINALYSIS, ROUTINE W REFLEX MICROSCOPIC     Status: Normal   Collection Time   03/04/12  4:50 PM      Component Value Range Comment   Color, Urine YELLOW  YELLOW    APPearance CLEAR  CLEAR    Specific Gravity, Urine 1.015  1.005 - 1.030    pH 6.5  5.0 - 8.0    Glucose, UA NEGATIVE  NEGATIVE mg/dL    Hgb urine dipstick NEGATIVE  NEGATIVE    Bilirubin Urine NEGATIVE  NEGATIVE    Ketones, ur NEGATIVE  NEGATIVE mg/dL    Protein, ur NEGATIVE  NEGATIVE mg/dL    Urobilinogen, UA 0.2  0.0 - 1.0 mg/dL    Nitrite NEGATIVE  NEGATIVE    Leukocytes, UA NEGATIVE  NEGATIVE MICROSCOPIC NOT DONE ON URINES WITH NEGATIVE PROTEIN, BLOOD, LEUKOCYTES, NITRITE, OR GLUCOSE <1000 mg/dL.  CBC WITH DIFFERENTIAL     Status: Abnormal   Collection Time   03/04/12  6:00 PM      Component Value Range Comment   WBC 8.5  4.0 - 10.5 K/uL    RBC  4.96  4.22 - 5.81 MIL/uL    Hemoglobin 15.5  13.0 - 17.0 g/dL    HCT 04.5  40.9 - 81.1 %    MCV 85.5  78.0 - 100.0 fL    MCH 31.3  26.0 - 34.0 pg    MCHC 36.6 (*) 30.0 - 36.0 g/dL    RDW 91.4  78.2 - 95.6 %    Platelets 203  150 - 400 K/uL    Neutrophils Relative 76  43 - 77 %    Neutro Abs 6.5  1.7 - 7.7 K/uL    Lymphocytes Relative 17  12 - 46 %    Lymphs Abs 1.5  0.7 - 4.0 K/uL    Monocytes Relative 7  3 - 12 %    Monocytes Absolute 0.6  0.1 - 1.0 K/uL    Eosinophils Relative 0  0 - 5 %    Eosinophils Absolute 0.0  0.0 - 0.7 K/uL    Basophils Relative 0  0 - 1 %    Basophils Absolute 0.0  0.0 - 0.1 K/uL   COMPREHENSIVE METABOLIC PANEL     Status:  Abnormal   Collection Time   03/04/12  6:00 PM      Component Value Range Comment   Sodium 141  135 - 145 mEq/L    Potassium 3.7  3.5 - 5.1 mEq/L    Chloride 103  96 - 112 mEq/L    CO2 27  19 - 32 mEq/L    Glucose, Bld 102 (*) 70 - 99 mg/dL    BUN 9  6 - 23 mg/dL    Creatinine, Ser 2.13  0.50 - 1.35 mg/dL    Calcium 9.8  8.4 - 08.6 mg/dL    Total Protein 7.6  6.0 - 8.3 g/dL    Albumin 4.9  3.5 - 5.2 g/dL    AST 18  0 - 37 U/L    ALT 32  0 - 53 U/L    Alkaline Phosphatase 52  39 - 117 U/L    Total Bilirubin 2.3 (*) 0.3 - 1.2 mg/dL    GFR calc non Af Amer >90  >90 mL/min    GFR calc Af Amer >90  >90 mL/min     Imaging / Studies: Ct Abdomen Pelvis Wo Contrast  03/04/2012  *RADIOLOGY REPORT*  Clinical Data: The right lower quadrant pain and nausea.  CT ABDOMEN AND PELVIS WITHOUT CONTRAST  Technique:  Multidetector CT imaging of the abdomen and pelvis was performed following the standard protocol without intravenous contrast.  Comparison: 08/16/2007  Findings: Calcified granuloma at the right lung base and calcified granulomas in the spleen.  Osseous structures are normal. Liver, bile ducts, pancreas, adrenal glands, and kidneys are normal.  The appendix is slightly more prominent than on the prior study with a diameter of 8 mm but there is no periappendiceal inflammation.  Terminal ileum is normal.  The remainder of the bowel is normal.  No osseous abnormality.  IMPRESSION: There is slight increased prominence of the appendix since the prior exam to a diameter of 8 mm.  No discrete periappendiceal inflammation.  The possibility of early appendicitis should be considered.  The remainder of the exam is normal.  Original Report Authenticated By: Gwynn Burly, M.D.    Medications / Allergies: per chart  Antibiotics: Anti-infectives     Start     Dose/Rate Route Frequency Ordered Stop   03/05/12 1000   ciprofloxacin (CIPRO) IVPB 400 mg  400 mg 200 mL/hr over 60 Minutes Intravenous  Every 12 hours 03/05/12 0101 03/06/12 0959   03/05/12 0400   metroNIDAZOLE (FLAGYL) IVPB 500 mg        500 mg 100 mL/hr over 60 Minutes Intravenous Every 6 hours 03/05/12 0101 03/06/12 0359   03/04/12 2200   metroNIDAZOLE (FLAGYL) IVPB 500 mg  Status:  Discontinued        500 mg 100 mL/hr over 60 Minutes Intravenous Every 6 hours 03/04/12 2112 03/05/12 0044   03/04/12 2200   ciprofloxacin (CIPRO) IVPB 400 mg  Status:  Discontinued        400 mg 200 mL/hr over 60 Minutes Intravenous Every 12 hours 03/04/12 2112 03/05/12 0101

## 2012-03-05 NOTE — Anesthesia Postprocedure Evaluation (Signed)
  Anesthesia Post-op Note  Patient: Anthony Mccullough  Procedure(s) Performed: Procedure(s) (LRB): APPENDECTOMY LAPAROSCOPIC (N/A)  Patient Location: PACU  Anesthesia Type: General  Level of Consciousness: awake and alert   Airway and Oxygen Therapy: Patient Spontanous Breathing  Post-op Pain: mild  Post-op Assessment: Post-op Vital signs reviewed, Patient's Cardiovascular Status Stable, Respiratory Function Stable, Patent Airway and No signs of Nausea or vomiting  Post-op Vital Signs: stable  Complications: No apparent anesthesia complications

## 2012-03-05 NOTE — Progress Notes (Signed)
Pt ambulated unit and walked one complete lap totaling 360 ft. Tolerated well, slight weakness noted but moving well.

## 2012-03-05 NOTE — Anesthesia Postprocedure Evaluation (Signed)
  Anesthesia Post-op Note  Patient: Anthony Mccullough  Procedure(s) Performed: Procedure(s) (LRB): APPENDECTOMY LAPAROSCOPIC (N/A)  Patient Location: PACU  Anesthesia Type: General  Level of Consciousness: awake, alert  and oriented  Airway and Oxygen Therapy: Patient Spontanous Breathing and Patient connected to nasal cannula oxygen  Post-op Pain: mild  Post-op Assessment: Post-op Vital signs reviewed, Patient's Cardiovascular Status Stable, Respiratory Function Stable, Patent Airway, No signs of Nausea or vomiting and Pain level controlled  Post-op Vital Signs: Reviewed and stable  Complications: No apparent anesthesia complications

## 2012-03-06 NOTE — Discharge Summary (Signed)
Physician Discharge Summary  Patient ID: Anthony Mccullough MRN: [id number] DOB/AGE: April 18, 1981 31 y.o.  Admit date: 03/04/2012 Discharge date: 03/06/2012  Admission Diagnoses: Acute appendicitis  Discharge Diagnoses:  Active Problems:  Acute appendicitis   Discharged Condition: good  Hospital Course:   The patient underwent appendectomy without difficulty.  Postoperatively, the patient mobilized and advanced to a solid diet gradually.  Pain was well-controlled and transitioned off IV medications.    By the time of discharge, the patient was walking well the hallways, eating food well, having flatus.  Pain was-controlled on an oral regimen.  Based on meeting DC criteria and recovering well, I felt it was safe for the patient to be discharged home with close followup.  Instructions were discussed in detail.  They are written as well.  Consults: None  Significant Diagnostic Studies: CT scan c/w appendicitis  Treatments: surgery: Lap appy  Discharge Exam: Blood pressure 114/69, pulse 82, temperature 98 F (36.7 C), temperature source Oral, resp. rate 18, height 5\' 8"  (1.727 m), weight 179 lb (81.194 kg), SpO2 96.00%.  General: Pt awake/alert/oriented x4 in no major acute distress Eyes: PERRL, normal EOM. Sclera nonicteric Neuro: CN II-XII intact w/o focal sensory/motor deficits. Lymph: No head/neck/groin lymphadenopathy Psych:  No delerium/psychosis/paranoia HENT: Normocephalic, Mucus membranes moist.  No thrush Neck: Supple, No tracheal deviation Chest: No pain.  Good respiratory excursion. CV:  Pulses intact.  Regular rhythm Abdomen: Soft, Nondistended.  Minimally tender at lap incisions.  No incarcerated hernias. Ext:  SCDs BLE.  No significant edema.  No cyanosis Skin: No petechiae / purpurae   Disposition: 01-Home or Self Care  Discharge Orders    Future Orders Please Complete By Expires   Diet - low sodium heart healthy      Increase activity slowly         Medication List  As of 03/06/2012  2:11 PM   TAKE these medications         ibuprofen 200 MG tablet   Commonly known as: ADVIL,MOTRIN   Take 200 mg by mouth every 6 (six) hours as needed. For pain.      loratadine 10 MG tablet   Commonly known as: CLARITIN   Take 10 mg by mouth daily.      oxyCODONE 5 MG immediate release tablet   Commonly known as: Oxy IR/ROXICODONE   Take 1-2 tablets (5-10 mg total) by mouth every 4 (four) hours as needed.           Follow-up Information    Follow up with Shamell Hittle C., MD. Schedule an appointment as soon as possible for a visit in 3 weeks.   Contact information:   [facility] [address] [place] [address] [phone]          Signed: Ardeth Sportsman. 03/06/2012, 2:11 PM

## 2012-03-07 ENCOUNTER — Encounter (HOSPITAL_COMMUNITY): Payer: Self-pay | Admitting: Surgery

## 2012-03-07 NOTE — Progress Notes (Signed)
Utilization review completed.  

## 2012-03-08 ENCOUNTER — Telehealth (INDEPENDENT_AMBULATORY_CARE_PROVIDER_SITE_OTHER): Payer: Self-pay

## 2012-03-08 NOTE — Telephone Encounter (Signed)
Called pt to check on him after surgery this weekend and the pt is doing ok. The pt has a f/u appt with Dr Michaell Cowing on 8/20.

## 2012-03-22 ENCOUNTER — Telehealth (INDEPENDENT_AMBULATORY_CARE_PROVIDER_SITE_OTHER): Payer: Self-pay | Admitting: General Surgery

## 2012-03-22 NOTE — Telephone Encounter (Signed)
Pt called letting us know that he is s/p appendectomy by Dr. Michaell Cowing on 7/19.  He was walking his dog and got pulled and since then has been having some pain at the surgery site.  He wanted to know if there was anything he could do to help or if he needed to be seen for this by Dr. Michaell Cowing.  Advised him to ice/heat the area as tolerated and to stay on either Aleve (2 tablets q12h) or Ibuprofen (600mg  q6h, or 800mg  q8h).  He said he would do this and would call back if he has any further problems.

## 2012-03-28 ENCOUNTER — Encounter (INDEPENDENT_AMBULATORY_CARE_PROVIDER_SITE_OTHER): Payer: BC Managed Care – PPO | Admitting: Surgery

## 2012-04-05 ENCOUNTER — Encounter (INDEPENDENT_AMBULATORY_CARE_PROVIDER_SITE_OTHER): Payer: Self-pay | Admitting: Surgery

## 2012-04-05 ENCOUNTER — Ambulatory Visit (INDEPENDENT_AMBULATORY_CARE_PROVIDER_SITE_OTHER): Payer: BC Managed Care – PPO | Admitting: Surgery

## 2012-04-05 VITALS — BP 122/84 | HR 68 | Temp 97.2°F | Resp 16 | Ht 68.0 in | Wt 183.0 lb

## 2012-04-05 DIAGNOSIS — K3580 Unspecified acute appendicitis: Secondary | ICD-10-CM

## 2012-04-05 DIAGNOSIS — L723 Sebaceous cyst: Secondary | ICD-10-CM

## 2012-04-05 NOTE — Patient Instructions (Signed)
See the Handout(s) we gave you.  Consider surgery.  Please call our office at [phone] if you wish to schedule surgery or if you have further questions / concerns.    Epidermal Cyst An epidermal cyst is sometimes called a sebaceous cyst, epidermal inclusion cyst, or infundibular cyst. These cysts usually contain a substance that looks "pasty" or "cheesy" and may have a bad smell. This substance is a protein called keratin. Epidermal cysts are usually found on the face, neck, or trunk. They may also occur in the vaginal area or other parts of the genitalia of both men and women. Epidermal cysts are usually small, painless, slow-growing bumps or lumps that move freely under the skin. It is important not to try to pop them. This may cause an infection and lead to tenderness and swelling. CAUSES  Epidermal cysts may be caused by a deep penetrating injury to the skin or a plugged hair follicle, often associated with acne. SYMPTOMS  Epidermal cysts can become inflamed and cause:  Redness.   Tenderness.   Increased temperature of the skin over the bumps or lumps.   Grayish-white, bad smelling material that drains from the bump or lump.  DIAGNOSIS  Epidermal cysts are easily diagnosed by your caregiver during an exam. Rarely, a tissue sample (biopsy) may be taken to rule out other conditions that may resemble epidermal cysts. TREATMENT   Epidermal cysts often get better and disappear on their own. They are rarely ever cancerous.   If a cyst becomes infected, it may become inflamed and tender. This may require opening and draining the cyst. Treatment with antibiotics may be necessary. When the infection is gone, the cyst may be removed with minor surgery.   Small, inflamed cysts can often be treated with antibiotics or by injecting steroid medicines.   Sometimes, epidermal cysts become large and bothersome. If this happens, surgical removal in your caregiver's office may be necessary.    HOME CARE INSTRUCTIONS  Only take over-the-counter or prescription medicines as directed by your caregiver.   Take your antibiotics as directed. Finish them even if you start to feel better.  SEEK MEDICAL CARE IF:   Your cyst becomes tender, red, or swollen.   Your condition is not improving or is getting worse.   You have any other questions or concerns.  MAKE SURE YOU:  Understand these instructions.   Will watch your condition.   Will get help right away if you are not doing well or get worse.  Document Released: 07/04/2004 Document Revised: 07/23/2011 Document Reviewed: 02/09/2011 [facility] Patient Information 2012 [place], [place].

## 2012-04-05 NOTE — Progress Notes (Signed)
Subjective:     Patient ID: Anthony Mccullough, male   DOB: [birth year], 31 y.o.   MRN: [id number]  HPI  Anthony Mccullough  [birth year] [id number]  Patient Care Team: Emeterio Reeve, MD as PCP - General (Family Medicine)  This patient is a 31 y.o.male who presents today for surgical evaluation.   Procedure: Laparoscopic appendectomy 03/04/2012  Pathology: Acute appendicitis  The patient comes today with his wife.  He traveled up to [place] and did a lot of walking.  By the end of the week he was gaining rather sore.  Was able to control with rest and ibuprofen.  That is all gone away.  He feels much better now.  Eating well.  Walking well.  No problem with urination or defecation.  He and his wife had numerous questions about the surgery and recovery.  I spent some time addressing them.  He still notes a lump on the back of his neck.  He saw my partner, Dr. Dwain Sarna, for this.  He was considering surgical removal of the probable sebaceous cyst.    He is more inclined to do it now as he feels like it's getting bigger & more sensitive.  He was hoping I could do it since we have gotten through one surgery already.  I noted that Dr. Dwain Sarna is a fine surgeon, but I am happy to continue his care if that's what he wishes.  Patient Active Problem List  Diagnosis  . Posterior 3cm neck mass, probable sebaceous cyst  . Acute appendicitis    Past Medical History  Diagnosis Date  . Sebaceous cyst     back of neck  . Nasal congestion   . Sore throat     Past Surgical History  Procedure Date  . Laparoscopic appendectomy 03/04/2012    Procedure: APPENDECTOMY LAPAROSCOPIC;  Surgeon: Ardeth Sportsman, MD;  Location: [facility];  Service: General;  Laterality: N/A;    History   Social History  . Marital Status: Married    Spouse Name: N/A    Number of Children: N/A  . Years of Education: N/A   Occupational History  . Not on file.   Social History Main Topics  . Smoking status: Never Smoker   .  Smokeless tobacco: Never Used  . Alcohol Use: Yes     1 or 2 glasses of wine per night  . Drug Use: No  . Sexually Active: Yes   Other Topics Concern  . Not on file   Social History Narrative  . No narrative on file    Family History  Problem Relation Age of Onset  . Cancer Maternal Grandfather     prostate    Current Outpatient Prescriptions  Medication Sig Dispense Refill  . ibuprofen (ADVIL,MOTRIN) 200 MG tablet Take 200 mg by mouth every 6 (six) hours as needed. For pain.      Marland Kitchen loratadine (CLARITIN) 10 MG tablet Take 10 mg by mouth daily.         Allergies  Allergen Reactions  . Penicillins Rash    BP 122/84  Pulse 68  Temp 97.2 F (36.2 C) (Temporal)  Resp 16  Ht 5\' 8"  (1.727 m)  Wt 183 lb (83.008 kg)  BMI 27.82 kg/m2  No results found.   Review of Systems  Constitutional: Negative for fever, chills and diaphoresis.  HENT: Negative for sore throat, trouble swallowing and neck pain.   Eyes: Negative for photophobia and visual disturbance.  Respiratory: Negative for choking and  shortness of breath.   Cardiovascular: Negative for chest pain and palpitations.  Gastrointestinal: Negative for nausea, vomiting, abdominal distention, anal bleeding and rectal pain.  Genitourinary: Negative for dysuria, urgency, difficulty urinating and testicular pain.  Musculoskeletal: Negative for myalgias, arthralgias and gait problem.  Skin: Negative for color change and rash.  Neurological: Negative for dizziness, speech difficulty, weakness and numbness.  Hematological: Negative for adenopathy.  Psychiatric/Behavioral: Negative for hallucinations, confusion and agitation.       Objective:   Physical Exam  Constitutional: He is oriented to person, place, and time. He appears well-developed and well-nourished. No distress.  HENT:  Head: Normocephalic.  Mouth/Throat: Oropharynx is clear and moist. No oropharyngeal exudate.  Eyes: Conjunctivae and EOM are normal. Pupils  are equal, round, and reactive to light. No scleral icterus.  Neck: Normal range of motion. No tracheal deviation present.    Cardiovascular: Normal rate, normal heart sounds and intact distal pulses.   Pulmonary/Chest: Effort normal. No respiratory distress.  Abdominal: Soft. He exhibits no distension. There is no tenderness. Hernia confirmed negative in the right inguinal area and confirmed negative in the left inguinal area.       Incisions clean with normal healing ridges.  No hernias  Musculoskeletal: Normal range of motion. He exhibits no tenderness.  Neurological: He is alert and oriented to person, place, and time. No cranial nerve deficit. He exhibits normal muscle tone. Coordination normal.  Skin: Skin is warm and dry. No rash noted. He is not diaphoretic.  Psychiatric: He has a normal mood and affect. His behavior is normal.       Assessment:     Appendicitis s/p lap appy, reocvering well  Enlarging post neck mass, prob seb cyst    Plan:     Increase activity as tolerated.  Do not push through pain.  Advanced on diet as tolerated. Bowel regimen to avoid problems.  Return to clinic p.r.n. For the appy.  The patient expressed understanding and appreciation  Removal of neck mass since enlarging & more sensitive.  He wishes to have me do the surgery since things went well w the appy:  The pathophysiology of skin & subcutaneous masses was discussed.  Natural history risks without surgery were discussed.  I recommended surgery to remove the mass.  I explained the technique of removal with use of local anesthesia & possible need for more aggressive sedation/anesthesia for patient comfort.    Risks such as bleeding, infection, heart attack, death, and other risks were discussed.  I noted a good likelihood this will help address the problem.   Possibility that this will not correct all symptoms was explained. Possibility of regrowth/recurrence of the mass was discussed.  We will  work to minimize complications. Questions were answered.  The patient expresses understanding & wishes to proceed with surgery.

## 2012-04-29 DIAGNOSIS — L723 Sebaceous cyst: Secondary | ICD-10-CM

## 2012-05-07 ENCOUNTER — Telehealth (INDEPENDENT_AMBULATORY_CARE_PROVIDER_SITE_OTHER): Payer: Self-pay | Admitting: General Surgery

## 2012-05-07 NOTE — Telephone Encounter (Signed)
S/p sebaceous cyst excision on back of neck.  He says that he took the bandage off and his redness, tenderness, and pus from the incision.  I recommended that he come into the ER for evaluation and if infected we would need to open the wound and pack the wound and likely antibiotics.

## 2012-05-17 ENCOUNTER — Ambulatory Visit (INDEPENDENT_AMBULATORY_CARE_PROVIDER_SITE_OTHER): Payer: BC Managed Care – PPO | Admitting: Surgery

## 2012-05-17 ENCOUNTER — Encounter (INDEPENDENT_AMBULATORY_CARE_PROVIDER_SITE_OTHER): Payer: Self-pay | Admitting: Surgery

## 2012-05-17 VITALS — BP 126/80 | HR 77 | Temp 98.6°F | Resp 18 | Ht 68.0 in | Wt 179.8 lb

## 2012-05-17 DIAGNOSIS — L723 Sebaceous cyst: Secondary | ICD-10-CM

## 2012-05-17 NOTE — Patient Instructions (Addendum)
GENERAL SURGERY: POST OP INSTRUCTIONS  1. DIET: Follow a light bland diet the first 24 hours after arrival home, such as soup, liquids, crackers, etc.  Be sure to include lots of fluids daily.  Avoid fast food or heavy meals as your are more likely to get nauseated.   2. Take your usually prescribed home medications unless otherwise directed. 3. PAIN CONTROL: a. Pain is best controlled by a usual combination of three different methods TOGETHER: i. Ice/Heat ii. Over the counter pain medication iii. Prescription pain medication b. Most patients will experience some swelling and bruising around the incisions.  Ice packs or heating pads (30-60 minutes up to 6 times a day) will help. Use ice for the first few days to help decrease swelling and bruising, then switch to heat to help relax tight/sore spots and speed recovery.  Some people prefer to use ice alone, heat alone, alternating between ice & heat.  Experiment to what works for you.  Swelling and bruising can take several weeks to resolve.   c. It is helpful to take an over-the-counter pain medication regularly for the first few weeks.  Choose one of the following that works best for you: i. Naproxen (Aleve, etc)  Two 220mg tabs twice a day ii. Ibuprofen (Advil, etc) Three 200mg tabs four times a day (every meal & bedtime) iii. Acetaminophen (Tylenol, etc) 500-650mg four times a day (every meal & bedtime) d. A  prescription for pain medication (such as oxycodone, hydrocodone, etc) should be given to you upon discharge.  Take your pain medication as prescribed.  i. If you are having problems/concerns with the prescription medicine (does not control pain, nausea, vomiting, rash, itching, etc), please call us [phone] to see if we need to switch you to a different pain medicine that will work better for you and/or control your side effect better. ii. If you need a refill on your pain medication, please contact your pharmacy.  They will contact our  office to request authorization. Prescriptions will not be filled after 5 pm or on week-ends. 4. Avoid getting constipated.  Between the surgery and the pain medications, it is common to experience some constipation.  Increasing fluid intake and taking a fiber supplement (such as Metamucil, Citrucel, FiberCon, MiraLax, etc) 1-2 times a day regularly will usually help prevent this problem from occurring.  A mild laxative (prune juice, Milk of Magnesia, MiraLax, etc) should be taken according to package directions if there are no bowel movements after 48 hours.   5. Wash / shower every day.  You may shower over the dressings as they are waterproof.  Continue to shower over incision(s) after the dressing is off. 6. Remove your waterproof bandages 5 days after surgery.  You may leave the incision open to air.  You may have skin tapes (Steri Strips) covering the incision(s).  Leave them on until one week, then remove.  You may replace a dressing/Band-Aid to cover the incision for comfort if you wish.      7. ACTIVITIES as tolerated:   a. You may resume regular (light) daily activities beginning the next day-such as daily self-care, walking, climbing stairs-gradually increasing activities as tolerated.  If you can walk 30 minutes without difficulty, it is safe to try more intense activity such as jogging, treadmill, bicycling, low-impact aerobics, swimming, etc. b. Save the most intensive and strenuous activity for last such as sit-ups, heavy lifting, contact sports, etc  Refrain from any heavy lifting or straining until you   are off narcotics for pain control.   c. DO NOT PUSH THROUGH PAIN.  Let pain be your guide: If it hurts to do something, don't do it.  Pain is your body warning you to avoid that activity for another week until the pain goes down. d. You may drive when you are no longer taking prescription pain medication, you can comfortably wear a seatbelt, and you can safely maneuver your car and apply  brakes. e. You may have sexual intercourse when it is comfortable.  8. FOLLOW UP in our office a. Please call [facility] at [phone] to set up an appointment to see your surgeon in the office for a follow-up appointment approximately 2-3 weeks after your surgery. b. Make sure that you call for this appointment the day you arrive home to insure a convenient appointment time. 9. IF YOU HAVE DISABILITY OR FAMILY LEAVE FORMS, BRING THEM TO THE OFFICE FOR PROCESSING.  DO NOT GIVE THEM TO YOUR DOCTOR.   WHEN TO CALL US [phone]: 1. Poor pain control 2. Reactions / problems with new medications (rash/itching, nausea, etc)  3. Fever over 101.5 F (38.5 C) 4. Worsening swelling or bruising 5. Continued bleeding from incision. 6. Increased pain, redness, or drainage from the incision   The clinic staff is available to answer your questions during regular business hours (8:30am-5pm).  Please don't hesitate to call and ask to speak to one of our nurses for clinical concerns.   If you have a medical emergency, go to the nearest emergency room or call 911.  A surgeon from [facility] is always on call at the hospitals   [facility] [address], [address], [place], [place]  [address] ? MAIN: [phone] ? TOLL FREE: [phone] ?  FAX [phone] [email or web address]  

## 2012-05-17 NOTE — Progress Notes (Signed)
Subjective:     Patient ID: Anthony Mccullough, male   DOB: 1981-05-12, 31 y.o.   MRN: [id number]  HPI  Anthony Mccullough  [birth year] [id number]  Patient Care Team: Emeterio Reeve, MD as PCP - General (Family Medicine)  This patient is a 31 y.o.male who presents today for surgical evaluation.   Patient returns now three weeks status post removal of large sebaceous cyst of posterior neck.  Initially had some drainage.  Redness and swelling.  There is concern of possible infection.  Then it calmed down.  Soreness gone away.  Back to work.  Using a Band-Aid to cover up the scar.  No fevers or chills.  Patient Active Problem List  Diagnosis  . Seb cyst, posterior neck, s/p excision  . Acute appendicitis    Past Medical History  Diagnosis Date  . Sebaceous cyst     back of neck  . Nasal congestion   . Sore throat     Past Surgical History  Procedure Date  . Laparoscopic appendectomy 03/04/2012    Procedure: APPENDECTOMY LAPAROSCOPIC;  Surgeon: Ardeth Sportsman, MD;  Location: [facility];  Service: General;  Laterality: N/A;    History   Social History  . Marital Status: Married    Spouse Name: N/A    Number of Children: N/A  . Years of Education: N/A   Occupational History  . Not on file.   Social History Main Topics  . Smoking status: Never Smoker   . Smokeless tobacco: Never Used  . Alcohol Use: Yes     1 or 2 glasses of wine per night  . Drug Use: No  . Sexually Active: Yes   Other Topics Concern  . Not on file   Social History Narrative  . No narrative on file    Family History  Problem Relation Age of Onset  . Cancer Maternal Grandfather     prostate    Current Outpatient Prescriptions  Medication Sig Dispense Refill  . ibuprofen (ADVIL,MOTRIN) 200 MG tablet Take 200 mg by mouth every 6 (six) hours as needed. For pain.      Marland Kitchen loratadine (CLARITIN) 10 MG tablet Take 10 mg by mouth as needed.          Allergies  Allergen Reactions  . Penicillins Rash    BP  126/80  Pulse 77  Temp 98.6 F (37 C) (Temporal)  Resp 18  Ht 5\' 8"  (1.727 m)  Wt 179 lb 12.8 oz (81.557 kg)  BMI 27.34 kg/m2  SpO2 98%  No results found.   Review of Systems  Constitutional: Negative for fever, chills and diaphoresis.  HENT: Negative for sore throat, trouble swallowing and neck pain.   Eyes: Negative for photophobia and visual disturbance.  Respiratory: Negative for choking and shortness of breath.   Cardiovascular: Negative for chest pain and palpitations.  Gastrointestinal: Negative for nausea, vomiting, abdominal distention, anal bleeding and rectal pain.  Genitourinary: Negative for dysuria, urgency, difficulty urinating and testicular pain.  Musculoskeletal: Negative for myalgias, arthralgias and gait problem.  Skin: Negative for color change and rash.  Neurological: Negative for dizziness, speech difficulty, weakness and numbness.  Hematological: Negative for adenopathy.  Psychiatric/Behavioral: Negative for hallucinations, confusion and agitation.       Objective:   Physical Exam  Constitutional: He is oriented to person, place, and time. He appears well-developed and well-nourished. No distress.  HENT:  Head: Normocephalic.  Mouth/Throat: Oropharynx is clear and moist. No oropharyngeal exudate.  Eyes: Conjunctivae  normal and EOM are normal. Pupils are equal, round, and reactive to light. No scleral icterus.  Neck: Normal range of motion. No tracheal deviation present.    Cardiovascular: Normal rate, normal heart sounds and intact distal pulses.   Pulmonary/Chest: Effort normal. No respiratory distress.  Abdominal: Soft. He exhibits no distension. There is no tenderness. Hernia confirmed negative in the right inguinal area and confirmed negative in the left inguinal area.       Incisions clean with normal healing ridges.  No hernias  Musculoskeletal: Normal range of motion. He exhibits no tenderness.  Neurological: He is alert and oriented to  person, place, and time. No cranial nerve deficit. He exhibits normal muscle tone. Coordination normal.  Skin: Skin is warm and dry. No rash noted. He is not diaphoretic.  Psychiatric: He has a normal mood and affect. His behavior is normal.       Assessment:     S/p removal of neck seb cyst, recovering    Plan:     Increase activity as tolerated.  Do not push through pain.  Advanced on diet as tolerated. Bowel regimen to avoid problems.  Return to clinic p.r.n. The patient expressed understanding and appreciation

## 2012-06-07 ENCOUNTER — Ambulatory Visit (INDEPENDENT_AMBULATORY_CARE_PROVIDER_SITE_OTHER): Payer: BC Managed Care – PPO | Admitting: Surgery

## 2012-06-07 ENCOUNTER — Encounter (INDEPENDENT_AMBULATORY_CARE_PROVIDER_SITE_OTHER): Payer: Self-pay | Admitting: Surgery

## 2012-06-07 VITALS — BP 116/76 | HR 84 | Temp 98.2°F | Resp 14 | Ht 68.0 in | Wt 178.0 lb

## 2012-06-07 DIAGNOSIS — Z9889 Other specified postprocedural states: Secondary | ICD-10-CM

## 2012-06-07 NOTE — Patient Instructions (Signed)
May trim suture if it recurs. Return as needed.

## 2012-06-07 NOTE — Progress Notes (Signed)
Patient ID: Anthony Mccullough, male   DOB: 1981/02/01, 31 y.o.   MRN: [id number] Patient returns today to have his incisions checked. Dr. Michaell Cowing removed a cyst from his posterior neck one month ago. Some of suture material was coming through the incision and the one and have it checked. No pain or drainage noted.  Exam: 2 cm incision posterior neck healing well. Small Vicryl suture trimmed.  Impression: Status post excision epidermal inclusion cyst posterior neck  Plan: Return as needed.

## 2012-12-04 IMAGING — CT CT ABD-PELV W/O CM
2 of 4 series · 17 of 46 positions shown, 19 images · non-contrast
Comparison: 08/16/2007

CLINICAL DATA: The right lower quadrant pain and nausea.

CT ABDOMEN AND PELVIS WITHOUT CONTRAST
TECHNIQUE: Multidetector CT imaging of the abdomen and pelvis was
performed following the standard protocol without intravenous
contrast.

[Series 2: abd/pelvis 5.0 b31f · axial · 0.81mm/px · z∈[-512,-77]mm · 14 of 97 slices shown, 16 images]
[im 5/97  soft-tissue]
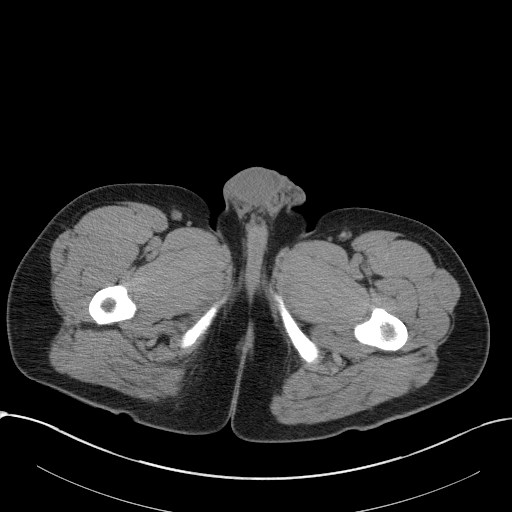
[im 5/97  bone]
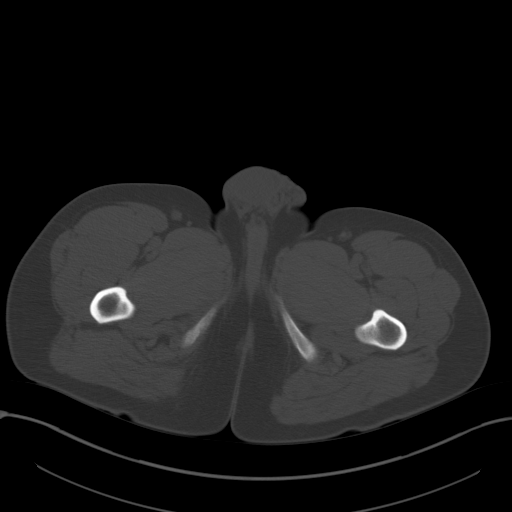
[im 14/97  soft-tissue]
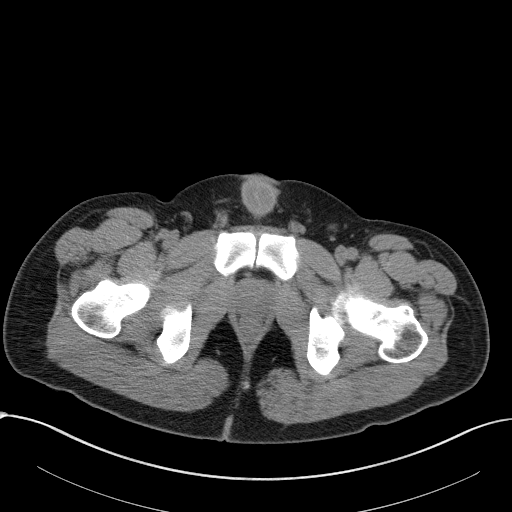
[im 18/97  soft-tissue]
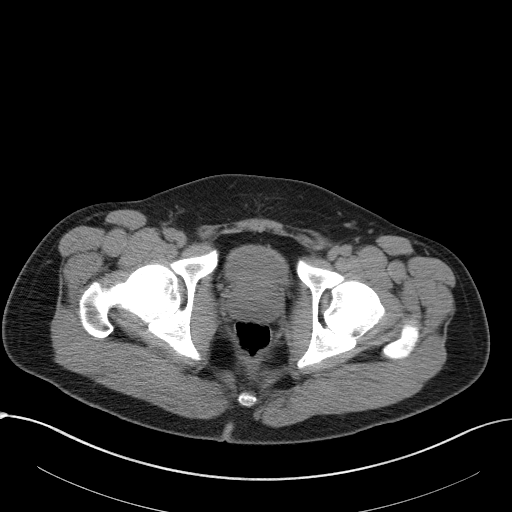
[im 27/97  soft-tissue]
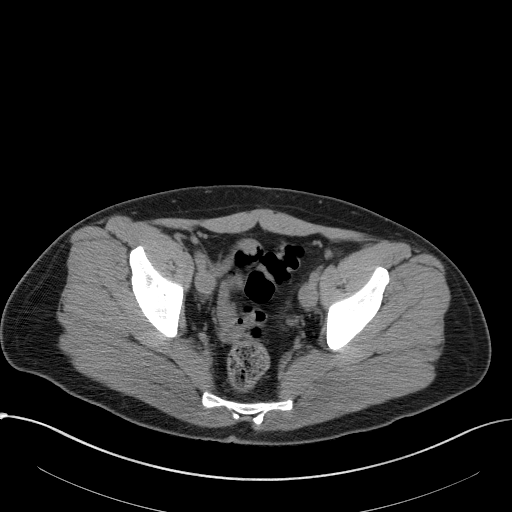
[im 31/97  soft-tissue]
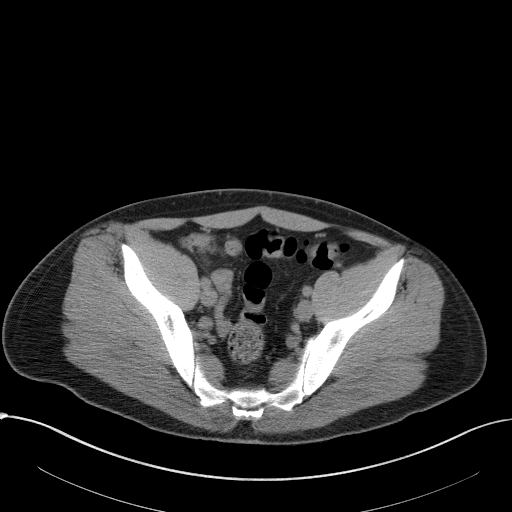
[im 40/97  soft-tissue]
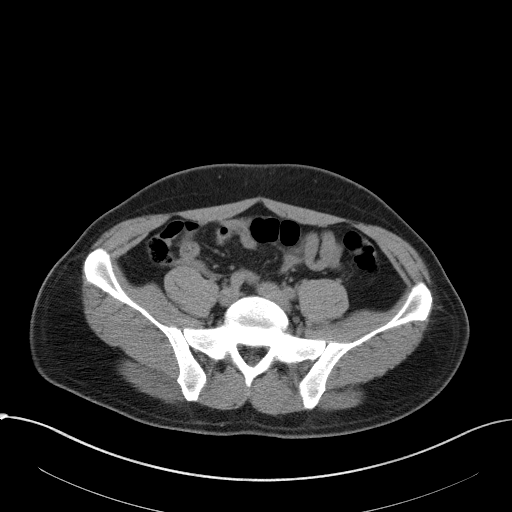
[im 44/97  soft-tissue]
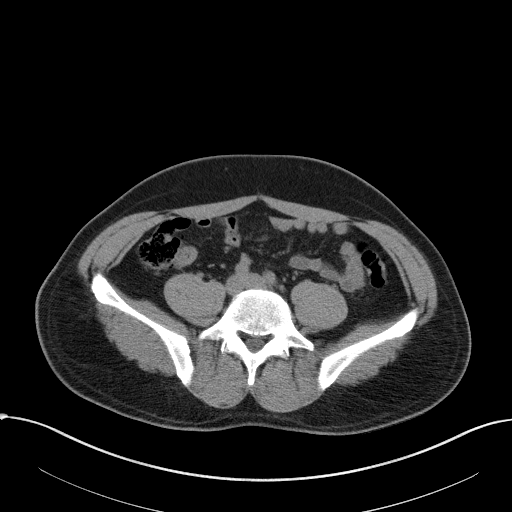
[im 53/97  soft-tissue]
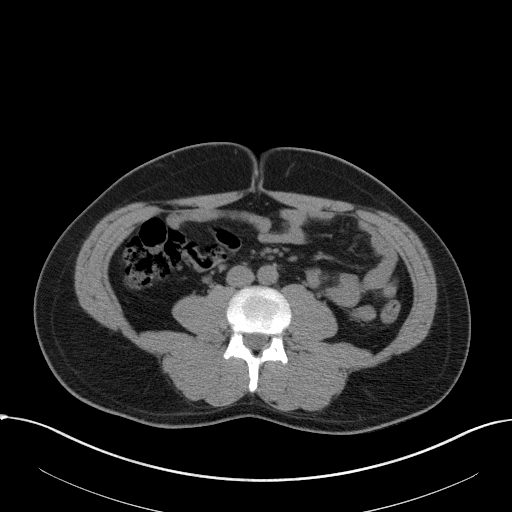
[im 57/97  soft-tissue]
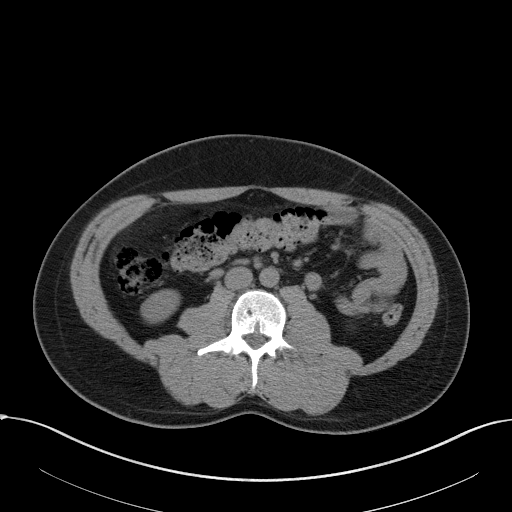
[im 57/97  bone]
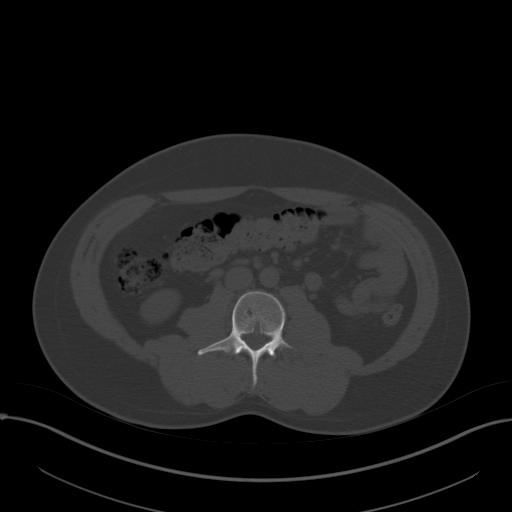
[im 66/97  soft-tissue]
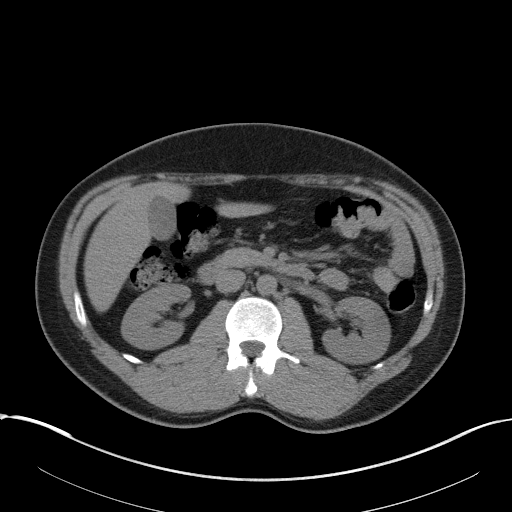
[im 70/97  soft-tissue]
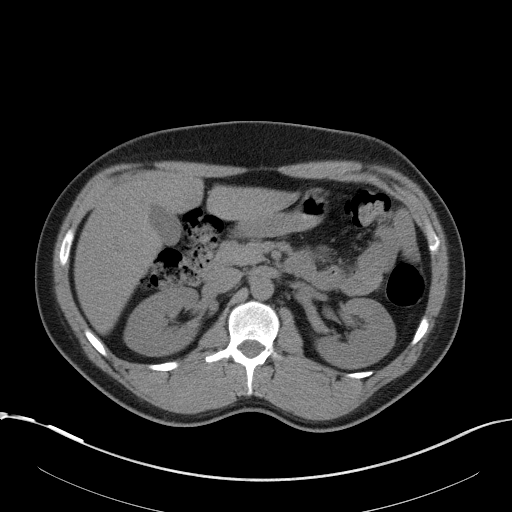
[im 79/97  soft-tissue]
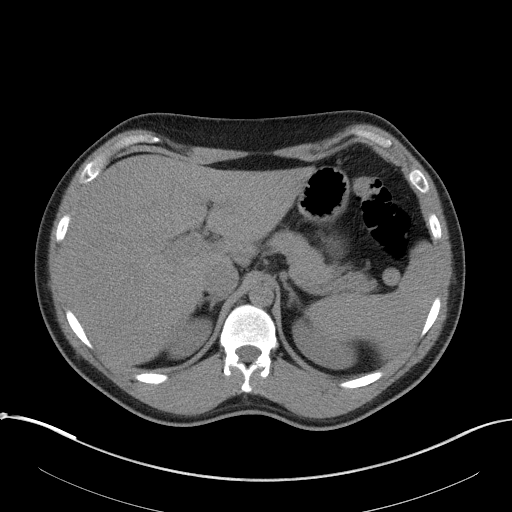
[im 83/97  soft-tissue]
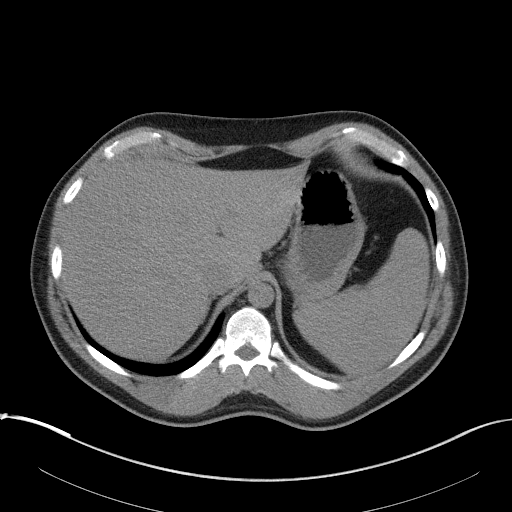
[im 92/97  soft-tissue]
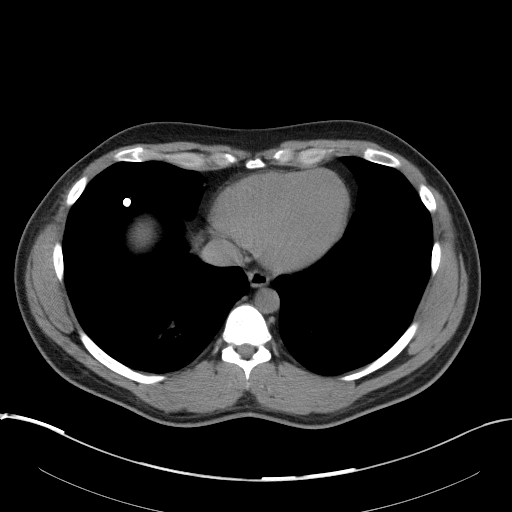

[Series 5: abd/pelvis 3.0 coronal · coronal · 0.84mm/px · 3 of 85 slices shown]
[im 29/85  soft-tissue]
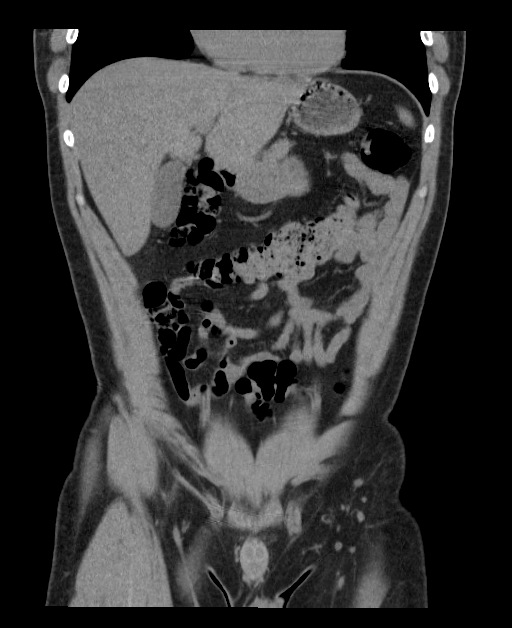
[im 38/85  soft-tissue]
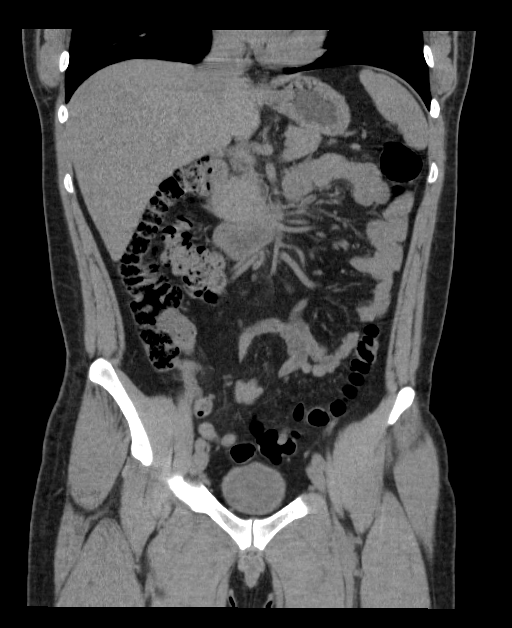
[im 47/85  soft-tissue]
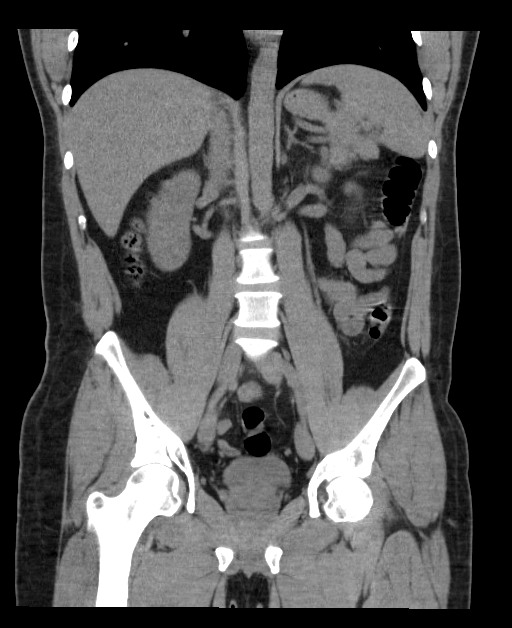

[17 of 46 positions shown; findings below may reference images not displayed]

FINDINGS: Calcified granuloma at the right lung base and calcified
granulomas in the spleen.  Osseous structures are normal. Liver,
bile ducts, pancreas, adrenal glands, and kidneys are normal.

The appendix is slightly more prominent than on the prior study
with a diameter of 8 mm but there is no periappendiceal
inflammation.  Terminal ileum is normal.  The remainder of the
bowel is normal.  No osseous abnormality.
IMPRESSION: There is slight increased prominence of the appendix since the
prior exam to a diameter of 8 mm.  No discrete periappendiceal
inflammation.  The possibility of early appendicitis should be
considered.  The remainder of the exam is normal.

## 2013-10-25 ENCOUNTER — Ambulatory Visit: Payer: 59 | Admitting: Physician Assistant

## 2013-10-25 VITALS — BP 130/76 | HR 76 | Temp 98.3°F | Resp 18 | Ht 68.0 in | Wt 191.2 lb

## 2013-10-25 DIAGNOSIS — R059 Cough, unspecified: Secondary | ICD-10-CM

## 2013-10-25 DIAGNOSIS — J101 Influenza due to other identified influenza virus with other respiratory manifestations: Secondary | ICD-10-CM

## 2013-10-25 DIAGNOSIS — J111 Influenza due to unidentified influenza virus with other respiratory manifestations: Secondary | ICD-10-CM

## 2013-10-25 DIAGNOSIS — R05 Cough: Secondary | ICD-10-CM

## 2013-10-25 LAB — POCT INFLUENZA A/B
INFLUENZA A, POC: NEGATIVE
Influenza B, POC: POSITIVE

## 2013-10-25 MED ORDER — IPRATROPIUM BROMIDE 0.06 % NA SOLN: 2.0000 | Freq: Three times a day (TID) | NASAL | Status: AC

## 2013-10-25 MED ORDER — AZITHROMYCIN 250 MG PO TABS: ORAL_TABLET | ORAL | Status: AC

## 2013-10-25 MED ORDER — ALBUTEROL SULFATE HFA 108 (90 BASE) MCG/ACT IN AERS: 2.0000 | INHALATION_SPRAY | RESPIRATORY_TRACT | Status: AC | PRN

## 2013-10-25 MED ORDER — E-Z SPACER DEVI: Status: AC

## 2013-10-25 MED ORDER — HYDROCODONE-HOMATROPINE 5-1.5 MG/5ML PO SYRP: ORAL_SOLUTION | ORAL | Status: AC

## 2013-10-25 NOTE — Progress Notes (Signed)
Subjective:    Patient ID: Anthony Mccullough, male    DOB: [birth year], 33 y.o.   MRN: [id number]  HPI 33 year old male presents with a 14 day history of nasal congestion, post nasal drip, sore throat, and cough. Sinus pressure along the bilateral frontal and maxillary sinuses. Afebrile. No chills. Nasal congestion thick and green/yellow. Cough is productive of green/yellow sputum and worse in the evenings and when he lays down at nighttime. No SOB or wheezing. Ears feel full, leading to sensation of muffled hearing. Some of the fullness in his ears has resolved as they did pop this morning. Has tried OTC cold preps without success. No GI complaints. Appetite decreased. Multiple sick contacts. Two confirmed influenza cases that he has been around. He did not get an influenza vaccine this year. He suspects that he did have influenza earlier this season, but did not seek medical evaluation or treatment at that time. No recent antibiotics, or recent travels. No leg trauma, sedentary periods, h/o cancer, or tobacco use.   PMH: Past Medical History  Diagnosis Date  . Sebaceous cyst     back of neck  . Nasal congestion   . Sore throat     Home Meds: Prior to Admission medications   Medication Sig Start Date End Date Taking? Authorizing Provider  ibuprofen (ADVIL,MOTRIN) 200 MG tablet Take 200 mg by mouth every 6 (six) hours as needed. For pain.   Yes Historical Provider, MD  loratadine (CLARITIN) 10 MG tablet Take 10 mg by mouth as needed.    Yes Historical Provider, MD    Allergies:  Allergies  Allergen Reactions  . Penicillins Rash    History   Social History  . Marital Status: Married    Spouse Name: N/A    Number of Children: N/A  . Years of Education: N/A   Occupational History  . Not on file.   Social History Main Topics  . Smoking status: Never Smoker   . Smokeless tobacco: Never Used  . Alcohol Use: Yes     Comment: 1 or 2 glasses of wine per night  . Drug Use: No  .  Sexual Activity: Yes   Other Topics Concern  . Not on file   Social History Narrative  . No narrative on file      Review of Systems  Constitutional: Positive for appetite change. Negative for fever and chills.       Pushing fluids.   HENT: Positive for congestion, ear pain, hearing loss, postnasal drip, rhinorrhea, sinus pressure and sore throat.        Frontal and maxillary sinus prressure.  Right otaliga/pressure.   Respiratory: Positive for cough.        Cough is productive of yellow/green sputum. Cough is worse in the evening and at night.   Gastrointestinal: Positive for nausea and diarrhea. Negative for vomiting.       Last with diarrhea this morning. It began the previous day.    Musculoskeletal: Negative for myalgias.  Neurological: Positive for headaches.       SInus headache.        Objective:   Physical Exam  Physical Exam: Blood pressure 130/76, pulse 76, temperature 98.3 F (36.8 C), temperature source Oral, resp. rate 18, height 5\' 8"  (1.727 m), weight 191 lb 3.2 oz (86.728 kg), SpO2 99.00%., Body mass index is 29.08 kg/(m^2). General: Well developed, well nourished, in no acute distress. Head: Normocephalic, atraumatic, eyes without discharge, sclera non-icteric, nares are congested.  Bilateral auditory canals with cerumen impaction. Status post ear lavage bilateral auditory canals clear. TM's are without perforation, pearly grey with reflective cone of light bilaterally. No sinus TTP. Oral cavity moist, dentition normal. Posterior pharynx with post nasal drip and mild erythema. No peritonsillar abscess or tonsillar exudate. Uvula midline.  Neck: Supple. No thyromegaly. Full ROM. No lymphadenopathy. No nuchal rigidity.  Lungs: Coarse breath sounds bilaterally without wheezes, rales, or rhonchi. Breathing is unlabored.  Heart: RRR with S1 S2. No murmurs, rubs, or gallops appreciated. Msk:  Strength and tone normal for age. Extremities: No clubbing or cyanosis. No  edema. Neuro: Alert and oriented X 3. Moves all extremities spontaneously. CNII-XII grossly in tact. Psych:  Responds to questions appropriately with a normal affect.   Labs: Results for orders placed in visit on 10/25/13  POCT INFLUENZA A/B      Result Value Ref Range   Influenza A, POC Negative     Influenza B, POC Positive         Assessment & Plan:  33 year old male with influenza B, cough, and bilateral cerumen impaction  1) Influenza B and cough -Outside of Tamiflu window -Treat for secondary bacterial infection -Azithromycin 250 MG #6 2 po first day then 1 po next 4 days no RF -Proventil 2 puffs inhaled q 4-6 hours prn #1 no RF -Hycodan #4oz 1 tsp po q 4-6 hours prn cough no RF SED -Spacer -Mucinex -Out of work until without symptoms for 48 hours per current CDC guidelines  -Rest/fluids -RTC precautions  2) Bilateral cerumen impaction  -Resolved  -Status post ear lavage  -Education provided    Eula Listenyan Azarian Starace, MHS, PA-C [facility] [address] [place], [place] [address] [phone] [facility] 10/25/2013 7:07 PM

## 2020-12-11 ENCOUNTER — Ambulatory Visit (INDEPENDENT_AMBULATORY_CARE_PROVIDER_SITE_OTHER): Payer: 59 | Admitting: Rehabilitative and Restorative Service Providers"

## 2020-12-11 ENCOUNTER — Other Ambulatory Visit: Payer: Self-pay

## 2020-12-11 ENCOUNTER — Encounter: Payer: Self-pay | Admitting: Rehabilitative and Restorative Service Providers"

## 2020-12-11 DIAGNOSIS — M899 Disorder of bone, unspecified: Secondary | ICD-10-CM | POA: Diagnosis not present

## 2020-12-11 DIAGNOSIS — M25511 Pain in right shoulder: Secondary | ICD-10-CM

## 2020-12-11 DIAGNOSIS — R293 Abnormal posture: Secondary | ICD-10-CM

## 2020-12-11 DIAGNOSIS — R29898 Other symptoms and signs involving the musculoskeletal system: Secondary | ICD-10-CM

## 2020-12-11 NOTE — Patient Instructions (Signed)
Access Code: [email or web address]: [email or web address]: 04/27/2022Prepared by: Maud Rubendall HoltExercises  Seated Scapular Retraction - 2 x daily - 7 x weekly - 1-2 sets - 10 reps - 10 sec hold  Shoulder External Rotation and Scapular Retraction - 2 x daily - 7 x weekly - 1 sets - 10 reps - 5 sec hold  Shoulder External Rotation in 45 Degrees Abduction - 2 x daily - 7 x weekly - 1-2 sets - 10 reps - 3 sec hold  Doorway Pec Stretch at 60 Degrees Abduction - 3 x daily - 7 x weekly - 3 reps - 1 sets  Doorway Pec Stretch at 90 Degrees Abduction - 3 x daily - 7 x weekly - 3 reps - 1 sets - 30 seconds hold  Doorway Pec Stretch at 120 Degrees Abduction - 3 x daily - 7 x weekly - 3 reps - 1 sets - 30 second hold hold Patient Education  Trigger Point Dry Needling  TENS Unit

## 2020-12-11 NOTE — Therapy (Signed)
[facility] [place] [address] [place], [place], [address] Phone: [phone]   Fax:  [phone]  Physical Therapy Evaluation  Patient Details  Name: Anthony Mccullough MRN: [id number] Date of Birth: 1981/03/21 Referring Provider (PT): Dr Ardith Dark   Encounter Date: 12/11/2020   PT End of Session - 12/11/20 1302    Visit Number 1    Number of Visits 12    Date for PT Re-Evaluation 01/22/21    PT Start Time 1104    PT Stop Time 1156    PT Time Calculation (min) 52 min    Activity Tolerance Patient tolerated treatment well           Past Medical History:  Diagnosis Date  . Nasal congestion   . Sebaceous cyst    back of neck  . Sore throat     Past Surgical History:  Procedure Laterality Date  . LAPAROSCOPIC APPENDECTOMY  03/04/2012   Procedure: APPENDECTOMY LAPAROSCOPIC;  Surgeon: Ardeth Sportsman, MD;  Location: [facility];  Service: General;  Laterality: N/A;    There were no vitals filed for this visit.    Subjective Assessment - 12/11/20 1109    Subjective Patient reports Rt shoulder pain for the past 1-2 weeks with no known injury. Did play pickle ball about a week ago but did not notice any soreness or problems. He has pain in the Rt shoulder and down into the Rt arm at times. Most of the time pain is in the posterior shoulder blade.    Pertinent History Seperated Rt shoulder at 40 yr old; fx Lt tib/fib at 40 yr old; covid 3/20 - long covid symptoms inc: SOB; fatigue; brain fog; memory loss; skin rash; UTI    Patient Stated Goals get rid of the shoulder pain    Currently in Pain? Yes    Pain Score 4     Pain Location Shoulder    Pain Orientation Right    Pain Descriptors / Indicators Tingling    Pain Type Acute pain    Pain Radiating Towards to top of shoulder mid deltoid    Pain Onset 1 to 4 weeks ago    Pain Frequency Intermittent   mostly constant   Aggravating Factors  work - cleaning work; sitting down;  driving (stick shift)    Pain Relieving Factors lying on the Lt side; rest; ice; heat; hot bath; massage              [facility] PT Assessment - 12/11/20 0001      Assessment   Medical Diagnosis Rt shoulder pain    Referring Provider (PT) Dr Ardith Dark    Onset Date/Surgical Date 12/04/20    Hand Dominance Right    Next MD Visit PRN    Prior Therapy for Lt LE      Precautions   Precautions None      Restrictions   Weight Bearing Restrictions No      Balance Screen   Has the patient fallen in the past 6 months No    Has the patient had a decrease in activity level because of a fear of falling?  No    Is the patient reluctant to leave their home because of a fear of falling?  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    Leisure yard work; indoor cycling bike 30-45 min/day; active      Observation/Other Assessments   Focus on Therapeutic Outcomes (FOTO)  59      Posture/Postural Control   Posture Comments head forward; shoudlers rounded and elevated; head of the humerus anterior in orientation; scapulae abducted and rotated along the thoracic wall; UE's in IR at side      AROM   Right Shoulder Extension 69 Degrees    Right Shoulder Flexion 142 Degrees    Right Shoulder ABduction 153 Degrees   with pain   Right Shoulder Internal Rotation --   thumb T 4   Right Shoulder External Rotation 74 Degrees   with pain   Left Shoulder Extension 64 Degrees    Left Shoulder Flexion 156 Degrees    Left Shoulder ABduction 168 Degrees    Left Shoulder Internal Rotation --   thumb  T3   Left Shoulder External Rotation 92 Degrees      Strength   Overall Strength Comments weakness Rt middle and lower trap 4+/5    Right/Left Shoulder --   Lt shoulder 5/5 Rt 5/5 except middle  and lower trap 4+/5     Palpation   Spinal mobility hypomobile mid to upper thoracic spine with PA and Rt . Lt lateral mobs    Palpation comment muscular tightness Rt > Lt middle trap; rhomboids as well as pecs; anterior deltoid; upper traps; leveator                      Objective measurements completed on examination: See above findings.       [facility] Adult PT Treatment/Exercise - 12/11/20 0001      Therapeutic Activites    Other Therapeutic Activities myofacial ball release work standing working through the thoracic spine area      Shoulder Exercises: Standing   Other Standing Exercises chin tuck 5 sec x 5; scap squeeze 10 sec x 5; L's x 5; W's x 5 with foam roll      Shoulder Exercises: Stretch   Other Shoulder Stretches doorway stretch 30 sec x 1 rep each position      Moist Heat Therapy   Number Minutes Moist Heat 10 Minutes    Moist Heat Location Shoulder;Other (comment)   thoracic spine     Electrical Stimulation   Electrical Stimulation Location Rt medial scapular border    Electrical Stimulation Action TENS    Electrical Stimulation Parameters to tolerance    Electrical Stimulation Goals Pain;Tone                  PT Education - 12/11/20 1151    Education Details POC HEP TENS DN    Person(s) Educated Patient    Methods Explanation;Demonstration;Tactile cues;Verbal cues;Handout    Comprehension Verbalized understanding;Returned demonstration;Verbal cues required;Tactile cues required               PT Long Term Goals - 12/11/20 1315      PT LONG TERM GOAL #1   Title Improve posture and alignment with patient to demonstrate improved upright posture with posterior shoulder girdle engaged and improved scapular positioning    Time 6    Period Weeks    Status New    Target Date 01/22/21      PT LONG TERM GOAL #2   Title Decrease frequency/intensity/duration of pain Rt  shoulder with patient to report 0-2/10 with all activities    Time 6     Period Weeks    Status New    Target Date 01/22/21      PT LONG TERM GOAL #3   Title Improve AROM Rt shoulder to =/> than AROM Lt shoulder with no pain    Time 6    Period Weeks    Status New    Target Date 01/22/21      PT LONG TERM GOAL #4   Title Independent in HEP    Time 6    Period Weeks    Status New    Target Date 01/22/21      PT LONG TERM GOAL #5   Title Improve functional limitation score to 77    Time 6    Period Weeks    Status New    Target Date 01/22/21                  Plan - 12/11/20 1303    Clinical Impression Statement Patient presents with ~ 1 week history of Rt shoulder pain which is almost constant in nature and worse with movement and functional activities. Patient has no known injury. He has poor posture and alignment; abnormal scapular movement; decreased AROM Rt shoulder; muscular tightness to palpation; limited functional activities. Patient will benefit from PT to address problems identified.    Stability/Clinical Decision Making Stable/Uncomplicated    Clinical Decision Making Low    Rehab Potential Good    PT Frequency 2x / week    PT Duration 6 weeks    PT Treatment/Interventions ADLs/Self Care Home Management;Aquatic Therapy;Cryotherapy;Electrical Stimulation;Iontophoresis 4mg /ml Dexamethasone;Moist Heat;Ultrasound;Functional mobility training;Therapeutic activities;Therapeutic exercise;Neuromuscular re-education;Patient/family education;Manual techniques;Passive range of motion;Dry needling;Taping;Vasopneumatic Device    PT Next Visit Plan review HEP; progress with postural correction and posterior shoulder girdle strengthening; DN/manual therapy Rt shoulder girdle; modalities as indicated    PT Home Exercise Plan [email or web address]    Consulted and Agree with Plan of Care Patient           Patient will benefit from skilled therapeutic intervention in order to improve the following deficits and impairments:  Increased fascial  restricitons,Decreased range of motion,Impaired UE functional use,Pain,Improper body mechanics,Decreased mobility,Decreased strength,Postural dysfunction  Visit Diagnosis: Acute pain of right shoulder  Scapular dysfunction  Other symptoms and signs involving the musculoskeletal system  Abnormal posture     Problem List Patient Active Problem List   Diagnosis Date Noted  . Post-operative state 06/07/2012  . Acute appendicitis 03/04/2012  . Seb cyst, posterior neck, s/p excision 12/21/2011    Ulah Olmo 02/20/2012 PT, MPH  12/11/2020, 1:21 PM  [facility] [address] [place] [address] [place], [place], [place] Phone: [phone]   Fax:  [phone]  Name: Chantry Headen MRN: Bertis Ruddy Date of Birth: 1981-01-23

## 2020-12-13 ENCOUNTER — Other Ambulatory Visit: Payer: Self-pay

## 2020-12-13 ENCOUNTER — Ambulatory Visit (INDEPENDENT_AMBULATORY_CARE_PROVIDER_SITE_OTHER): Payer: 59 | Admitting: Rehabilitative and Restorative Service Providers"

## 2020-12-13 ENCOUNTER — Encounter: Payer: Self-pay | Admitting: Rehabilitative and Restorative Service Providers"

## 2020-12-13 DIAGNOSIS — R29898 Other symptoms and signs involving the musculoskeletal system: Secondary | ICD-10-CM

## 2020-12-13 DIAGNOSIS — R293 Abnormal posture: Secondary | ICD-10-CM | POA: Diagnosis not present

## 2020-12-13 DIAGNOSIS — M899 Disorder of bone, unspecified: Secondary | ICD-10-CM

## 2020-12-13 DIAGNOSIS — M25511 Pain in right shoulder: Secondary | ICD-10-CM | POA: Diagnosis not present

## 2020-12-13 NOTE — Patient Instructions (Signed)
Access Code: [email or web address]: [email or web address]: 04/29/2022Prepared by: Namita Yearwood HoltExercises  Seated Scapular Retraction - 2 x daily - 7 x weekly - 1-2 sets - 10 reps - 10 sec hold  Shoulder External Rotation and Scapular Retraction - 2 x daily - 7 x weekly - 1 sets - 10 reps - 5 sec hold  Shoulder External Rotation in 45 Degrees Abduction - 2 x daily - 7 x weekly - 1-2 sets - 10 reps - 3 sec hold  Doorway Pec Stretch at 60 Degrees Abduction - 3 x daily - 7 x weekly - 3 reps - 1 sets  Doorway Pec Stretch at 90 Degrees Abduction - 3 x daily - 7 x weekly - 3 reps - 1 sets - 30 seconds hold  Doorway Pec Stretch at 120 Degrees Abduction - 3 x daily - 7 x weekly - 3 reps - 1 sets - 30 second hold hold  Shoulder External Rotation and Scapular Retraction with Resistance - 2 x daily - 7 x weekly - 1 sets - 10 reps - 3-5 sec hold  Standing Bilateral Low Shoulder Row with Anchored Resistance - 2 x daily - 7 x weekly - 1-3 sets - 10 reps - 2-3 sec hold

## 2020-12-13 NOTE — Therapy (Signed)
[facility] [place] [address] [place] [address] [place], [place], [address] Phone: [phone]   Fax:  [phone]  Physical Therapy Treatment  Patient Details  Name: Anthony Mccullough MRN: [id number] Date of Birth: November 23, 1980 Referring Provider (PT): Dr Ardith Dark   Encounter Date: 12/13/2020   PT End of Session - 12/13/20 1020    Visit Number 2    Number of Visits 12    Date for PT Re-Evaluation 01/22/21    PT Start Time 1020    PT Stop Time 1105    PT Time Calculation (min) 45 min    Activity Tolerance Patient tolerated treatment well           Past Medical History:  Diagnosis Date  . Nasal congestion   . Sebaceous cyst    back of neck  . Sore throat     Past Surgical History:  Procedure Laterality Date  . LAPAROSCOPIC APPENDECTOMY  03/04/2012   Procedure: APPENDECTOMY LAPAROSCOPIC;  Surgeon: Ardeth Sportsman, MD;  Location: [facility];  Service: General;  Laterality: N/A;    There were no vitals filed for this visit.   Subjective Assessment - 12/13/20 1021    Subjective Patient felt some improvement following initial treatment Wednesday and yesterday. Went to work yesterday and had increase in burning pain through the Rt shoulder. no tingling since Wednesday - now burning.    Currently in Pain? Yes    Pain Score 4     Pain Location Shoulder    Pain Orientation Right    Pain Descriptors / Indicators Burning    Pain Type Acute pain                             [facility] Adult PT Treatment/Exercise - 12/13/20 0001      Shoulder Exercises: Standing   Extension Strengthening;Both;5 reps;Theraband    Theraband Level (Shoulder Extension) Level 2 (Red)    Extension Limitations difficult - incresaed shoulder burning, not issued for home    Row Strengthening;Both;10 reps;Theraband    Theraband Level (Shoulder Row) Level 2 (Red)    Retraction Strengthening;Both;Theraband;5 reps    Theraband Level (Shoulder Retraction)  Level 1 (Yellow)    Other Standing Exercises chin tuck 5 sec x 5; scap squeeze 10 sec x 5; L's x 5; W's x 5 with foam roll      Shoulder Exercises: Stretch   Wall Stretch - Flexion 3 reps;30 seconds    Wall Stretch - Flexion Limitations stepping through doorway hands on doorframe    Wall Stretch - ABduction 2 reps;30 seconds   T-at wall   Other Shoulder Stretches doorway stretch 30 sec x 1 rep each position    Other Shoulder Stretches thoracic stretch on coregeous ball arms at ~ 80 deg abd x ~ 2 min      Moist Heat Therapy   Number Minutes Moist Heat 10 Minutes    Moist Heat Location Shoulder;Other (comment)   thoracic spine     Electrical Stimulation   Electrical Stimulation Location Rt medial scapular border    Electrical Stimulation Action TENS    Electrical Stimulation Parameters to tolerance    Electrical Stimulation Goals Pain;Tone      Manual Therapy   Manual therapy comments skilled palpation to assess response to DN/manual work    Joint Mobilization thoracic mobs PA and lateral through mid thoracic spine    Soft tissue mobilization STM thoracic paraspinals, rhomboids; lower  and mid traps Rt > Lt    Myofascial Release thoracic paraspinals            Trigger Point Dry Needling - 12/13/20 0001    Consent Given? Yes    Education Handout Provided Yes    Other Dry Needling bilat    Thoracic multifidi response Palpable increased muscle length                PT Education - 12/13/20 1046    Education Details HEP    Person(s) Educated Patient    Methods Explanation;Demonstration;Tactile cues;Verbal cues;Handout    Comprehension Verbalized understanding;Returned demonstration;Verbal cues required;Tactile cues required               PT Long Term Goals - 12/11/20 1315      PT LONG TERM GOAL #1   Title Improve posture and alignment with patient to demonstrate improved upright posture with posterior shoulder girdle engaged and improved scapular positioning     Time 6    Period Weeks    Status New    Target Date 01/22/21      PT LONG TERM GOAL #2   Title Decrease frequency/intensity/duration of pain Rt shoulder with patient to report 0-2/10 with all activities    Time 6    Period Weeks    Status New    Target Date 01/22/21      PT LONG TERM GOAL #3   Title Improve AROM Rt shoulder to =/> than AROM Lt shoulder with no pain    Time 6    Period Weeks    Status New    Target Date 01/22/21      PT LONG TERM GOAL #4   Title Independent in HEP    Time 6    Period Weeks    Status New    Target Date 01/22/21      PT LONG TERM GOAL #5   Title Improve functional limitation score to 77    Time 6    Period Weeks    Status New    Target Date 01/22/21                 Plan - 12/13/20 1027    Clinical Impression Statement God response to initial treatment and HEP. Some increase in symptoms with work yesterday. Reviewed HEP. Added posterior shoulder girdle strengthening. Trial of DN and manual work through mid-thoracic spine. Patient tolerated treatment well with improved mobility post treatment.    Rehab Potential Good    PT Frequency 2x / week    PT Duration 6 weeks    PT Treatment/Interventions ADLs/Self Care Home Management;Aquatic Therapy;Cryotherapy;Electrical Stimulation;Iontophoresis 4mg /ml Dexamethasone;Moist Heat;Ultrasound;Functional mobility training;Therapeutic activities;Therapeutic exercise;Neuromuscular re-education;Patient/family education;Manual techniques;Passive range of motion;Dry needling;Taping;Vasopneumatic Device    PT Next Visit Plan review HEP; progress with postural correction and posterior shoulder girdle strengthening; assess response to DN/manual therapy Rt shoulder girdle; modalities as indicated    PT Home Exercise Plan [email or web address]    Consulted and Agree with Plan of Care Patient           Patient will benefit from skilled therapeutic intervention in order to improve the following deficits and impairments:      Visit Diagnosis: Acute pain of right shoulder  Scapular dysfunction  Other symptoms and signs involving the musculoskeletal system  Abnormal posture     Problem List Patient Active Problem List   Diagnosis Date Noted  . Post-operative state 06/07/2012  . Acute appendicitis 03/04/2012  . Seb  cyst, posterior neck, s/p excision 12/21/2011    Anthony Mccullough PT, MPH  12/13/2020, 2:12 PM  [facility] [address] [place], [place], [address] Phone: [phone]   Fax:  [phone]  Name: Anthony Mccullough MRN: [id number] Date of Birth: 05-01-1981

## 2020-12-16 ENCOUNTER — Encounter: Payer: Self-pay | Admitting: Rehabilitative and Restorative Service Providers"

## 2020-12-16 ENCOUNTER — Ambulatory Visit: Payer: 59 | Admitting: Rehabilitative and Restorative Service Providers"

## 2020-12-16 ENCOUNTER — Other Ambulatory Visit: Payer: Self-pay

## 2020-12-16 DIAGNOSIS — R29898 Other symptoms and signs involving the musculoskeletal system: Secondary | ICD-10-CM | POA: Diagnosis not present

## 2020-12-16 DIAGNOSIS — R293 Abnormal posture: Secondary | ICD-10-CM | POA: Diagnosis not present

## 2020-12-16 DIAGNOSIS — M25511 Pain in right shoulder: Secondary | ICD-10-CM | POA: Diagnosis not present

## 2020-12-16 DIAGNOSIS — M899 Disorder of bone, unspecified: Secondary | ICD-10-CM

## 2020-12-16 NOTE — Patient Instructions (Signed)
Access Code: [email or web address]: [email or web address]: 05/02/2022Prepared by: Jordanne Elsbury HoltExercises  Seated Scapular Retraction - 2 x daily - 7 x weekly - 1-2 sets - 10 reps - 10 sec hold  Shoulder External Rotation and Scapular Retraction - 2 x daily - 7 x weekly - 1 sets - 10 reps - 5 sec hold  Shoulder External Rotation in 45 Degrees Abduction - 2 x daily - 7 x weekly - 1-2 sets - 10 reps - 3 sec hold  Doorway Pec Stretch at 60 Degrees Abduction - 3 x daily - 7 x weekly - 3 reps - 1 sets  Doorway Pec Stretch at 90 Degrees Abduction - 3 x daily - 7 x weekly - 3 reps - 1 sets - 30 seconds hold  Doorway Pec Stretch at 120 Degrees Abduction - 3 x daily - 7 x weekly - 3 reps - 1 sets - 30 second hold hold  Shoulder External Rotation and Scapular Retraction with Resistance - 2 x daily - 7 x weekly - 1 sets - 10 reps - 3-5 sec hold  Standing Bilateral Low Shoulder Row with Anchored Resistance - 2 x daily - 7 x weekly - 1-3 sets - 10 reps - 2-3 sec hold  Seated Thoracic Extension and Rotation with Reach - 2 x daily - 7 x weekly - 1 sets - 5 reps - 10 sec hold  Seated Thoracic Lumbar Extension with Pectoralis Stretch - 2 x daily - 7 x weekly - 1 sets - 3-5 reps - 10 sec hold

## 2020-12-16 NOTE — Therapy (Signed)
[facility] [place] [address] [place], [place], [address] Phone: [phone]   Fax:  [phone]  Physical Therapy Treatment  Patient Details  Name: Anthony Mccullough MRN: [id number] Date of Birth: 1981/03/10 Referring Provider (PT): Dr Ardith Dark   Encounter Date: 12/16/2020   PT End of Session - 12/16/20 1018    Visit Number 3    Number of Visits 12    Date for PT Re-Evaluation 01/22/21    PT Start Time 1018    PT Stop Time 1110    PT Time Calculation (min) 52 min    Activity Tolerance Patient tolerated treatment well           Past Medical History:  Diagnosis Date  . Nasal congestion   . Sebaceous cyst    back of neck  . Sore throat     Past Surgical History:  Procedure Laterality Date  . LAPAROSCOPIC APPENDECTOMY  03/04/2012   Procedure: APPENDECTOMY LAPAROSCOPIC;  Surgeon: Ardeth Sportsman, MD;  Location: [facility];  Service: General;  Laterality: N/A;    There were no vitals filed for this visit.   Subjective Assessment - 12/16/20 1021    Subjective Patient reports positive response to DN. has a TENS unit but not sure of the settings. He woke up with very little pain. Just did some window cleaning this am and has had some increased tingling since then. Patient had some periods of no pain over the weekend.    Currently in Pain? Yes    Pain Score 3     Pain Location Shoulder    Pain Orientation Right    Pain Descriptors / Indicators Tingling    Pain Type Acute pain              [facility] PT Assessment - 12/16/20 0001      Assessment   Medical Diagnosis Rt shoulder pain    Referring Provider (PT) Dr Ardith Dark    Onset Date/Surgical Date 12/04/20    Hand Dominance Right    Next MD Visit PRN    Prior Therapy for Lt LE      Palpation   Spinal mobility hypomobile mid to upper thoracic spine with PA and Rt . Lt lateral mobs    Palpation comment muscular tightness Rt > Lt thoracic paraspinals; middle trap;  rhomboids as well as pecs; anterior deltoid; upper traps; leveator                         [facility] Adult PT Treatment/Exercise - 12/16/20 0001      Shoulder Exercises: Supine   Other Supine Exercises trunk rotation arms ~ 80 deg abduction    Other Supine Exercises lat stretch 30 sec x 2 PT assist      Shoulder Exercises: Seated   Other Seated Exercises thoracic rotation UE's in shoulder flexion x 5 reps x 10 sec hold    Other Seated Exercises thoracic extension over chair 10 sec x 3 reps      Shoulder Exercises: Standing   Row Strengthening;Both;10 reps;Theraband    Theraband Level (Shoulder Row) Level 2 (Red)    Row Limitations bow and arrow red TB x 5 each UE    Retraction Strengthening;Both;Theraband;5 reps    Theraband Level (Shoulder Retraction) Level 1 (Yellow)    Other Standing Exercises chin tuck 5 sec x 5; scap squeeze 10 sec x 5; L's x 5; W's x 5 with foam roll  Shoulder Exercises: Stretch   Wall Stretch - Flexion 3 reps;30 seconds    Wall Stretch - Flexion Limitations stepping through doorway hands on doorframe    Wall Stretch - ABduction 2 reps;30 seconds   T-at wall   Other Shoulder Stretches doorway stretch 30 sec x 1 rep each position      Moist Heat Therapy   Number Minutes Moist Heat 10 Minutes    Moist Heat Location Shoulder;Other (comment)   thoracic spine     Electrical Stimulation   Electrical Stimulation Location Rt medial scapular border x 3; Lt thoracic x 1    Electrical Stimulation Action TENS    Electrical Stimulation Parameters to tolerance    Electrical Stimulation Goals Pain;Tone      Manual Therapy   Manual therapy comments skilled palpation to assess response to DN/manual work    Joint Mobilization thoracic mobs PA and lateral through mid thoracic spine    Soft tissue mobilization STM thoracic paraspinals, rhomboids; lower and mid traps Rt > Lt    Myofascial Release thoracic paraspinals            Trigger Point Dry  Needling - 12/16/20 0001    Consent Given? Yes    Education Handout Provided Previously provided    Other Dry Needling bilat    Thoracic multifidi response Palpable increased muscle length                PT Education - 12/16/20 1104    Education Details HEP    Person(s) Educated Patient    Methods Explanation;Demonstration;Tactile cues;Verbal cues;Handout    Comprehension Verbalized understanding;Returned demonstration;Verbal cues required;Tactile cues required               PT Long Term Goals - 12/11/20 1315      PT LONG TERM GOAL #1   Title Improve posture and alignment with patient to demonstrate improved upright posture with posterior shoulder girdle engaged and improved scapular positioning    Time 6    Period Weeks    Status New    Target Date 01/22/21      PT LONG TERM GOAL #2   Title Decrease frequency/intensity/duration of pain Rt shoulder with patient to report 0-2/10 with all activities    Time 6    Period Weeks    Status New    Target Date 01/22/21      PT LONG TERM GOAL #3   Title Improve AROM Rt shoulder to =/> than AROM Lt shoulder with no pain    Time 6    Period Weeks    Status New    Target Date 01/22/21      PT LONG TERM GOAL #4   Title Independent in HEP    Time 6    Period Weeks    Status New    Target Date 01/22/21      PT LONG TERM GOAL #5   Title Improve functional limitation score to 77    Time 6    Period Weeks    Status New    Target Date 01/22/21                 Plan - 12/16/20 1023    Clinical Impression Statement Positive response to DN and manual work. Patient reports increased mobility. More aware of posture and positions and how they effect pain. Working on exercised consistently at home. Persistent hypomobility through the thoracic spine; muscular tightness through thoracic area. Cotninued with manual work and  modalities. Added exercises.    Rehab Potential Good    PT Frequency 2x / week    PT Duration 6  weeks    PT Treatment/Interventions ADLs/Self Care Home Management;Aquatic Therapy;Cryotherapy;Electrical Stimulation;Iontophoresis 4mg /ml Dexamethasone;Moist Heat;Ultrasound;Functional mobility training;Therapeutic activities;Therapeutic exercise;Neuromuscular re-education;Patient/family education;Manual techniques;Passive range of motion;Dry needling;Taping;Vasopneumatic Device    PT Next Visit Plan review HEP; progress with postural correction and posterior shoulder girdle strengthening; continue DN/manual therapy Rt shoulder girdle; modalities as indicated    PT Home Exercise Plan [email or web address]    Consulted and Agree with Plan of Care Patient           Patient will benefit from skilled therapeutic intervention in order to improve the following deficits and impairments:     Visit Diagnosis: Acute pain of right shoulder  Scapular dysfunction  Other symptoms and signs involving the musculoskeletal system  Abnormal posture     Problem List Patient Active Problem List   Diagnosis Date Noted  . Post-operative state 06/07/2012  . Acute appendicitis 03/04/2012  . Seb cyst, posterior neck, s/p excision 12/21/2011    Ryoma Nofziger 02/20/2012 PT, MPH  12/16/2020, 11:13 AM  [facility] [address] [place], [place], [place] Phone: [phone]   Fax:  [phone]  Name: Winson Eichorn MRN: Bertis Ruddy Date of Birth: 1981/07/26

## 2020-12-19 ENCOUNTER — Other Ambulatory Visit: Payer: Self-pay

## 2020-12-19 ENCOUNTER — Encounter: Payer: Self-pay | Admitting: Rehabilitative and Restorative Service Providers"

## 2020-12-19 ENCOUNTER — Ambulatory Visit (INDEPENDENT_AMBULATORY_CARE_PROVIDER_SITE_OTHER): Payer: 59 | Admitting: Rehabilitative and Restorative Service Providers"

## 2020-12-19 DIAGNOSIS — R29898 Other symptoms and signs involving the musculoskeletal system: Secondary | ICD-10-CM

## 2020-12-19 DIAGNOSIS — R293 Abnormal posture: Secondary | ICD-10-CM | POA: Diagnosis not present

## 2020-12-19 DIAGNOSIS — M25511 Pain in right shoulder: Secondary | ICD-10-CM | POA: Diagnosis not present

## 2020-12-19 DIAGNOSIS — M899 Disorder of bone, unspecified: Secondary | ICD-10-CM | POA: Diagnosis not present

## 2020-12-19 NOTE — Therapy (Signed)
[facility] [place] [address] [place], [place], [address] Phone: [phone]   Fax:  [phone]  Physical Therapy Treatment  Patient Details  Name: Anthony Mccullough MRN: [id number] Date of Birth: 10-21-1980 Referring Provider (PT): Dr Ardith Dark   Encounter Date: 12/19/2020   PT End of Session - 12/19/20 1401    Visit Number 4    Number of Visits 12    Date for PT Re-Evaluation 01/22/21    PT Start Time 1400    PT Stop Time 1448    PT Time Calculation (min) 48 min    Activity Tolerance Patient tolerated treatment well           Past Medical History:  Diagnosis Date  . Nasal congestion   . Sebaceous cyst    back of neck  . Sore throat     Past Surgical History:  Procedure Laterality Date  . LAPAROSCOPIC APPENDECTOMY  03/04/2012   Procedure: APPENDECTOMY LAPAROSCOPIC;  Surgeon: Ardeth Sportsman, MD;  Location: [facility];  Service: General;  Laterality: N/A;    There were no vitals filed for this visit.   Subjective Assessment - 12/19/20 1402    Subjective Sore from the DN and sitting in the car for a beach trip. Yesterday mid back felt "great" Today sore at times.    Currently in Pain? Yes    Pain Score 3     Pain Location Shoulder    Pain Orientation Right    Pain Descriptors / Indicators Sore    Pain Type Acute pain                             [facility] Adult PT Treatment/Exercise - 12/19/20 0001      Shoulder Exercises: Prone   Other Prone Exercises prone series arms at side; W; goalpost; T; Y; superman x 5 reps x 3-5 sec hold each      Shoulder Exercises: Standing   Row Strengthening;Both;10 reps;Theraband   isometric hold for step back x 5   Theraband Level (Shoulder Row) Level 3 (Green)    Row Limitations bow and arrow red TB x 5 each UE    Retraction Strengthening;Both;Theraband;5 reps    Theraband Level (Shoulder Retraction) Level 2 (Red)      Shoulder Exercises: ROM/Strengthening    UBE (Upper Arm Bike) L5 x 4 min alt fwd/back      Shoulder Exercises: Stretch   Wall Stretch - Flexion 1 rep;30 seconds    Wall Stretch - Flexion Limitations stepping through doorway hands on doorframe    Wall Stretch - ABduction 2 reps;30 seconds   T-at wall   Other Shoulder Stretches doorway stretch 30 sec x 2 rep each position      Moist Heat Therapy   Number Minutes Moist Heat 10 Minutes    Moist Heat Location Shoulder;Other (comment)   thoracic spine     Electrical Stimulation   Electrical Stimulation Location bilat thoracic spine    Electrical Stimulation Action TENS    Electrical Stimulation Parameters to tolerance    Electrical Stimulation Goals Pain;Tone      Manual Therapy   Manual therapy comments skilled palpation to assess response to DN/manual work    Joint Mobilization thoracic mobs PA and lateral through mid thoracic spine    Soft tissue mobilization STM thoracic paraspinals, rhomboids; lower and mid traps Rt > Lt    Myofascial Release thoracic paraspinals  Trigger Point Dry Needling - 12/19/20 0001    Consent Given? Yes    Education Handout Provided Previously provided    Other Dry Needling bilat    Thoracic multifidi response Palpable increased muscle length                PT Education - 12/19/20 1423    Education Details HEP    Person(s) Educated Patient    Methods Explanation;Demonstration;Tactile cues;Verbal cues;Handout    Comprehension Verbalized understanding;Returned demonstration;Verbal cues required;Tactile cues required               PT Long Term Goals - 12/11/20 1315      PT LONG TERM GOAL #1   Title Improve posture and alignment with patient to demonstrate improved upright posture with posterior shoulder girdle engaged and improved scapular positioning    Time 6    Period Weeks    Status New    Target Date 01/22/21      PT LONG TERM GOAL #2   Title Decrease frequency/intensity/duration of pain Rt shoulder with  patient to report 0-2/10 with all activities    Time 6    Period Weeks    Status New    Target Date 01/22/21      PT LONG TERM GOAL #3   Title Improve AROM Rt shoulder to =/> than AROM Lt shoulder with no pain    Time 6    Period Weeks    Status New    Target Date 01/22/21      PT LONG TERM GOAL #4   Title Independent in HEP    Time 6    Period Weeks    Status New    Target Date 01/22/21      PT LONG TERM GOAL #5   Title Improve functional limitation score to 77    Time 6    Period Weeks    Status New    Target Date 01/22/21                 Plan - 12/19/20 1401    Clinical Impression Statement Continued improvement with patient reporting less pain and discomfort. Added posterior shoulder girdle strengthening in prone and progressed theraband for home program. Continued good muscular release with DN and manual work.    Rehab Potential Good    PT Frequency 2x / week    PT Duration 6 weeks    PT Treatment/Interventions ADLs/Self Care Home Management;Aquatic Therapy;Cryotherapy;Electrical Stimulation;Iontophoresis 4mg /ml Dexamethasone;Moist Heat;Ultrasound;Functional mobility training;Therapeutic activities;Therapeutic exercise;Neuromuscular re-education;Patient/family education;Manual techniques;Passive range of motion;Dry needling;Taping;Vasopneumatic Device    PT Next Visit Plan review HEP; progress with postural correction and posterior shoulder girdle strengthening; continue DN/manual therapy Rt shoulder girdle; modalities as indicated    PT Home Exercise Plan [email or web address]    Consulted and Agree with Plan of Care Patient           Patient will benefit from skilled therapeutic intervention in order to improve the following deficits and impairments:     Visit Diagnosis: Acute pain of right shoulder  Scapular dysfunction  Other symptoms and signs involving the musculoskeletal system  Abnormal posture     Problem List Patient Active Problem List   Diagnosis  Date Noted  . Post-operative state 06/07/2012  . Acute appendicitis 03/04/2012  . Seb cyst, posterior neck, s/p excision 12/21/2011    Elby Blackwelder 02/20/2012 PT, MPH 12/19/2020, 2:45 PM  [facility] [address] [place], [place], [place] Phone:  [phone]   Fax:  [phone]  Name: Hari Casaus MRN: [id number] Date of Birth: 09/21/1980

## 2020-12-19 NOTE — Patient Instructions (Signed)
Access Code: [email or web address]: [email or web address]: 05/05/2022Prepared by: Trevar Boehringer HoltExercises  Seated Scapular Retraction - 2 x daily - 7 x weekly - 1-2 sets - 10 reps - 10 sec hold  Shoulder External Rotation and Scapular Retraction - 2 x daily - 7 x weekly - 1 sets - 10 reps - 5 sec hold  Shoulder External Rotation in 45 Degrees Abduction - 2 x daily - 7 x weekly - 1-2 sets - 10 reps - 3 sec hold  Doorway Pec Stretch at 60 Degrees Abduction - 3 x daily - 7 x weekly - 3 reps - 1 sets  Doorway Pec Stretch at 90 Degrees Abduction - 3 x daily - 7 x weekly - 3 reps - 1 sets - 30 seconds hold  Doorway Pec Stretch at 120 Degrees Abduction - 3 x daily - 7 x weekly - 3 reps - 1 sets - 30 second hold hold  Shoulder External Rotation and Scapular Retraction with Resistance - 2 x daily - 7 x weekly - 1 sets - 10 reps - 3-5 sec hold  Standing Bilateral Low Shoulder Row with Anchored Resistance - 2 x daily - 7 x weekly - 1-3 sets - 10 reps - 2-3 sec hold  Seated Thoracic Extension and Rotation with Reach - 2 x daily - 7 x weekly - 1 sets - 5 reps - 10 sec hold  Seated Thoracic Lumbar Extension with Pectoralis Stretch - 2 x daily - 7 x weekly - 1 sets - 3-5 reps - 10 sec hold  Prone Scapular Retraction - 2 x daily - 7 x weekly - 1 sets - 5-10 reps - 3-5 sec hold  Prone W Scapular Retraction - 2 x daily - 7 x weekly - 1 sets - 5-10 reps - 3-5 sec hold  Prone Scapular Retraction in Abduction - 2 x daily - 7 x weekly - 1 sets - 5-10 reps - 3-5 sec hold  Prone Scapular Retraction Arms at Side - 2 x daily - 7 x weekly - 1 sets - 5-10 reps - 3-5 sec hold  Prone Scapular Retraction Y - 2 x daily - 7 x weekly - 1 sets - 5-10 reps - 3-5 sec hold  Prone Scapular Retraction in Flexion - 2 x daily - 7 x weekly - 1 sets - 5-10 reps - 3-5 sec hold

## 2020-12-23 ENCOUNTER — Encounter: Payer: Self-pay | Admitting: Rehabilitative and Restorative Service Providers"

## 2020-12-23 ENCOUNTER — Ambulatory Visit (INDEPENDENT_AMBULATORY_CARE_PROVIDER_SITE_OTHER): Payer: 59 | Admitting: Rehabilitative and Restorative Service Providers"

## 2020-12-23 ENCOUNTER — Other Ambulatory Visit: Payer: Self-pay

## 2020-12-23 DIAGNOSIS — R293 Abnormal posture: Secondary | ICD-10-CM

## 2020-12-23 DIAGNOSIS — M25511 Pain in right shoulder: Secondary | ICD-10-CM

## 2020-12-23 DIAGNOSIS — M899 Disorder of bone, unspecified: Secondary | ICD-10-CM

## 2020-12-23 DIAGNOSIS — R29898 Other symptoms and signs involving the musculoskeletal system: Secondary | ICD-10-CM | POA: Diagnosis not present

## 2020-12-23 NOTE — Patient Instructions (Addendum)
  Access Code: [email or web address]: [email or web address]: 05/09/2022Prepared by: Shea Swalley HoltExercises  Seated Scapular Retraction - 2 x daily - 7 x weekly - 1-2 sets - 10 reps - 10 sec hold  Shoulder External Rotation and Scapular Retraction - 2 x daily - 7 x weekly - 1 sets - 10 reps - 5 sec hold  Shoulder External Rotation in 45 Degrees Abduction - 2 x daily - 7 x weekly - 1-2 sets - 10 reps - 3 sec hold  Doorway Pec Stretch at 60 Degrees Abduction - 3 x daily - 7 x weekly - 3 reps - 1 sets  Doorway Pec Stretch at 90 Degrees Abduction - 3 x daily - 7 x weekly - 3 reps - 1 sets - 30 seconds hold  Doorway Pec Stretch at 120 Degrees Abduction - 3 x daily - 7 x weekly - 3 reps - 1 sets - 30 second hold hold  Shoulder External Rotation and Scapular Retraction with Resistance - 2 x daily - 7 x weekly - 1 sets - 10 reps - 3-5 sec hold  Standing Bilateral Low Shoulder Row with Anchored Resistance - 2 x daily - 7 x weekly - 1-3 sets - 10 reps - 2-3 sec hold  Seated Thoracic Extension and Rotation with Reach - 2 x daily - 7 x weekly - 1 sets - 5 reps - 10 sec hold  Prone Scapular Retraction - 2 x daily - 7 x weekly - 1 sets - 5-10 reps - 3-5 sec hold  Prone W Scapular Retraction - 2 x daily - 7 x weekly - 1 sets - 5-10 reps - 3-5 sec hold  Prone Scapular Retraction in Abduction - 2 x daily - 7 x weekly - 1 sets - 5-10 reps - 3-5 sec hold  Prone Scapular Retraction Arms at Side - 2 x daily - 7 x weekly - 1 sets - 5-10 reps - 3-5 sec hold  Prone Scapular Retraction Y - 2 x daily - 7 x weekly - 1 sets - 5-10 reps - 3-5 sec hold  Prone Scapular Retraction in Flexion - 2 x daily - 7 x weekly - 1 sets - 5-10 reps - 3-5 sec hold  Seated Thoracic Lumbar Extension with Pectoralis Stretch - 2 x daily - 7 x weekly - 1 sets - 5-8 reps - 10 sec hold  Prone Press Up - 2 x daily - 7 x weekly - 1 sets - 10 reps - 2-3 sec hold

## 2020-12-23 NOTE — Therapy (Addendum)
[facility] [place] [address] [place], [place], [address] Phone: [phone]   Fax:  [phone]  Physical Therapy Treatment  Patient Details  Name: Anthony Mccullough MRN: [id number] Date of Birth: Mar 19, 1981 Referring Provider (PT): Dr Ardith Dark   Encounter Date: 12/23/2020   PT End of Session - 12/23/20 1345    Visit Number 5    Number of Visits 12    Date for PT Re-Evaluation 01/22/21    PT Start Time 1344    PT Stop Time 1433    PT Time Calculation (min) 49 min    Activity Tolerance Patient tolerated treatment well           Past Medical History:  Diagnosis Date  . Nasal congestion   . Sebaceous cyst    back of neck  . Sore throat     Past Surgical History:  Procedure Laterality Date  . LAPAROSCOPIC APPENDECTOMY  03/04/2012   Procedure: APPENDECTOMY LAPAROSCOPIC;  Surgeon: Ardeth Sportsman, MD;  Location: [facility];  Service: General;  Laterality: N/A;    There were no vitals filed for this visit.   Subjective Assessment - 12/23/20 1346    Subjective Episode of fatigue over the weekend. Slept and rested a lot which seems to help. Has not returned to cycling. He is afraid to cycle due to the bike fit and his seat can't be adjusted.    Currently in Pain? Yes    Pain Score 1     Pain Location Shoulder    Pain Orientation Right    Pain Descriptors / Indicators Sore    Pain Type Acute pain    Pain Onset More than a month ago    Pain Frequency Intermittent              [facility] PT Assessment - 12/23/20 0001      Assessment   Medical Diagnosis Rt shoulder pain    Referring Provider (PT) Dr Ardith Dark    Onset Date/Surgical Date 12/04/20    Hand Dominance Right    Next MD Visit PRN    Prior Therapy for Lt LE      Palpation   Palpation comment decreased muscular tightness Rt > Lt thoracic paraspinals; middle trap; rhomboids as well as pecs; anterior deltoid; upper traps; leveator                          [facility] Adult PT Treatment/Exercise - 12/23/20 0001      Shoulder Exercises: Prone   Other Prone Exercises prone series arms at side; W; goalpost; T; Y; superman x 5 reps x 3-5 sec hold each      Shoulder Exercises: ROM/Strengthening   UBE (Upper Arm Bike) L7 x 4 min alt fwd/back      Shoulder Exercises: Stretch   Wall Stretch - Flexion 1 rep;30 seconds    Wall Stretch - Flexion Limitations stepping through doorway hands on doorframe    Wall Stretch - ABduction 2 reps;30 seconds   T-at wall   Other Shoulder Stretches doorway stretch 30 sec x 2 rep each position      Moist Heat Therapy   Number Minutes Moist Heat 10 Minutes    Moist Heat Location Shoulder;Other (comment)   thoracic spine     Electrical Stimulation   Electrical Stimulation Location bilat thoracic spine    Electrical Stimulation Action TENS    Electrical Stimulation Parameters to tolerance  Electrical Stimulation Goals Pain;Tone      Manual Therapy   Manual therapy comments skilled palpation to assess response to DN/manual work    Joint Mobilization thoracic mobs PA and lateral through mid thoracic spine    Soft tissue mobilization STM thoracic paraspinals, rhomboids; lower and mid traps Rt > Lt    Myofascial Release thoracic paraspinals            Trigger Point Dry Needling - 12/23/20 0001    Consent Given? Yes    Education Handout Provided Previously provided    Other Dry Needling bilat    Thoracic multifidi response Palpable increased muscle length                PT Education - 12/23/20 1433    Education Details HEP    Person(s) Educated Patient    Methods Explanation;Demonstration;Tactile cues;Verbal cues;Handout    Comprehension Verbalized understanding;Returned demonstration;Verbal cues required;Tactile cues required               PT Long Term Goals - 12/11/20 1315      PT LONG TERM GOAL #1   Title Improve posture and alignment with patient to  demonstrate improved upright posture with posterior shoulder girdle engaged and improved scapular positioning    Time 6    Period Weeks    Status New    Target Date 01/22/21      PT LONG TERM GOAL #2   Title Decrease frequency/intensity/duration of pain Rt shoulder with patient to report 0-2/10 with all activities    Time 6    Period Weeks    Status New    Target Date 01/22/21      PT LONG TERM GOAL #3   Title Improve AROM Rt shoulder to =/> than AROM Lt shoulder with no pain    Time 6    Period Weeks    Status New    Target Date 01/22/21      PT LONG TERM GOAL #4   Title Independent in HEP    Time 6    Period Weeks    Status New    Target Date 01/22/21      PT LONG TERM GOAL #5   Title Improve functional limitation score to 77    Time 6    Period Weeks    Status New    Target Date 01/22/21                 Plan - 12/23/20 1348    Clinical Impression Statement Fatigued over the weekend. Unable to do his exercises over the weekend. Reviewed HEP for strengthening posterior shoulder girdle strengthening.    Rehab Potential Good    PT Frequency 2x / week    PT Duration 6 weeks    PT Treatment/Interventions ADLs/Self Care Home Management;Aquatic Therapy;Cryotherapy;Electrical Stimulation;Iontophoresis 4mg /ml Dexamethasone;Moist Heat;Ultrasound;Functional mobility training;Therapeutic activities;Therapeutic exercise;Neuromuscular re-education;Patient/family education;Manual techniques;Passive range of motion;Dry needling;Taping;Vasopneumatic Device    PT Next Visit Plan review HEP; progress with postural correction and posterior shoulder girdle strengthening; continue DN/manual therapy Rt shoulder girdle; modalities as indicated    PT Home Exercise Plan [email or web address]    Consulted and Agree with Plan of Care Patient           Patient will benefit from skilled therapeutic intervention in order to improve the following deficits and impairments:     Visit Diagnosis: Acute  pain of right shoulder  Scapular dysfunction  Other symptoms and signs involving the musculoskeletal system  Abnormal  posture     Problem List Patient Active Problem List   Diagnosis Date Noted  . Post-operative state 06/07/2012  . Acute appendicitis 03/04/2012  . Seb cyst, posterior neck, s/p excision 12/21/2011    Chanise Habeck Rober Minion PT, MPH  12/23/2020, 2:33 PM  [facility] [address] [place], [place], [address] Phone: [phone]   Fax:  [phone]  Name: Khian Remo MRN: [id number] Date of Birth: 21-Feb-1981

## 2020-12-27 ENCOUNTER — Other Ambulatory Visit: Payer: Self-pay

## 2020-12-27 ENCOUNTER — Ambulatory Visit (INDEPENDENT_AMBULATORY_CARE_PROVIDER_SITE_OTHER): Payer: 59 | Admitting: Rehabilitative and Restorative Service Providers"

## 2020-12-27 ENCOUNTER — Encounter: Payer: Self-pay | Admitting: Rehabilitative and Restorative Service Providers"

## 2020-12-27 DIAGNOSIS — R29898 Other symptoms and signs involving the musculoskeletal system: Secondary | ICD-10-CM | POA: Diagnosis not present

## 2020-12-27 DIAGNOSIS — M25511 Pain in right shoulder: Secondary | ICD-10-CM

## 2020-12-27 DIAGNOSIS — M899 Disorder of bone, unspecified: Secondary | ICD-10-CM | POA: Diagnosis not present

## 2020-12-27 DIAGNOSIS — R293 Abnormal posture: Secondary | ICD-10-CM

## 2020-12-27 NOTE — Patient Instructions (Signed)
Access Code: [email or web address]: [email or web address]: 05/13/2022Prepared by: Mirtie Bastyr HoltExercises  Seated Scapular Retraction - 2 x daily - 7 x weekly - 1-2 sets - 10 reps - 10 sec hold  Shoulder External Rotation and Scapular Retraction - 2 x daily - 7 x weekly - 1 sets - 10 reps - 5 sec hold  Shoulder External Rotation in 45 Degrees Abduction - 2 x daily - 7 x weekly - 1-2 sets - 10 reps - 3 sec hold  Doorway Pec Stretch at 60 Degrees Abduction - 3 x daily - 7 x weekly - 3 reps - 1 sets  Doorway Pec Stretch at 90 Degrees Abduction - 3 x daily - 7 x weekly - 3 reps - 1 sets - 30 seconds hold  Doorway Pec Stretch at 120 Degrees Abduction - 3 x daily - 7 x weekly - 3 reps - 1 sets - 30 second hold hold  Shoulder External Rotation and Scapular Retraction with Resistance - 2 x daily - 7 x weekly - 1 sets - 10 reps - 3-5 sec hold  Standing Bilateral Low Shoulder Row with Anchored Resistance - 2 x daily - 7 x weekly - 1-3 sets - 10 reps - 2-3 sec hold  Seated Thoracic Extension and Rotation with Reach - 2 x daily - 7 x weekly - 1 sets - 5 reps - 10 sec hold  Prone Scapular Retraction - 2 x daily - 7 x weekly - 1 sets - 5-10 reps - 3-5 sec hold  Prone W Scapular Retraction - 2 x daily - 7 x weekly - 1 sets - 5-10 reps - 3-5 sec hold  Prone Scapular Retraction in Abduction - 2 x daily - 7 x weekly - 1 sets - 5-10 reps - 3-5 sec hold  Prone Scapular Retraction Arms at Side - 2 x daily - 7 x weekly - 1 sets - 5-10 reps - 3-5 sec hold  Prone Scapular Retraction Y - 2 x daily - 7 x weekly - 1 sets - 5-10 reps - 3-5 sec hold  Prone Scapular Retraction in Flexion - 2 x daily - 7 x weekly - 1 sets - 5-10 reps - 3-5 sec hold  Seated Thoracic Lumbar Extension with Pectoralis Stretch - 2 x daily - 7 x weekly - 1 sets - 5-8 reps - 10 sec hold  Prone Press Up - 2 x daily - 7 x weekly - 1 sets - 10 reps - 2-3 sec hold  Standing Lat Pull Down with Resistance - Elbows Bent - 2 x daily - 7 x weekly - 1 sets -  10 reps - 3 sec hold  Anti-Rotation Lateral Stepping with Press - 2 x daily - 7 x weekly - 1-2 sets - 10 reps - 2-3 sec hold  Bilateral Scapular Depression with Anchored Resistance - Straight Arm - 2 x daily - 7 x weekly - 2 sets - 10 reps - 3-5 sec hold

## 2020-12-27 NOTE — Therapy (Signed)
[facility] [place] [address] [place], [place], [address] Phone: [phone]   Fax:  [phone]  Physical Therapy Treatment  Patient Details  Name: Anthony Mccullough MRN: [id number] Date of Birth: 22-Feb-1981 Referring Provider (PT): Dr Ardith Dark   Encounter Date: 12/27/2020   PT End of Session - 12/27/20 0849    Visit Number 6    Number of Visits 12    Date for PT Re-Evaluation 01/22/21    PT Start Time 0848    PT Stop Time 0936    PT Time Calculation (min) 48 min    Activity Tolerance Patient tolerated treatment well           Past Medical History:  Diagnosis Date  . Nasal congestion   . Sebaceous cyst    back of neck  . Sore throat     Past Surgical History:  Procedure Laterality Date  . LAPAROSCOPIC APPENDECTOMY  03/04/2012   Procedure: APPENDECTOMY LAPAROSCOPIC;  Surgeon: Ardeth Sportsman, MD;  Location: [facility];  Service: General;  Laterality: N/A;    There were no vitals filed for this visit.   Subjective Assessment - 12/27/20 0849    Subjective Gradually improving. Less pain in general. 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 Can tell he has increased pain with poor posture in sitting or standing.    Currently in Pain? Yes    Pain Score 1     Pain Location Thoracic    Pain Orientation Right    Pain Descriptors / Indicators Sore    Pain Type Acute pain                             [facility] Adult PT Treatment/Exercise - 12/27/20 0001      Shoulder Exercises: Prone   Other Prone Exercises prone series arms at side; W; goalpost; T; Y; superman x 5 reps x 3-5 sec hold each      Shoulder Exercises: Standing   Extension Strengthening;Both;5 reps;Theraband    Theraband Level (Shoulder Extension) Level 3 (Green)    Extension Limitations scapular depression with red TB x 10    Row Strengthening;Both;10 reps;Theraband    Theraband Level (Shoulder Row) Level  3 (Green)    Retraction Strengthening;Both;Theraband;5 reps    Theraband Level (Shoulder Retraction) Level 2 (Red)    Other Standing Exercises lat pull blue TB x 10 reps x 2 sets    Other Standing Exercises antirotation red TB x 10 reps each side      Shoulder Exercises: Stretch   Wall Stretch - Flexion 1 rep;30 seconds    Wall Stretch - Flexion Limitations stepping through doorway hands on doorframe    Wall Stretch - ABduction 2 reps;30 seconds   T-at wall   Other Shoulder Stretches doorway stretch 30 sec x 2 rep each position      Moist Heat Therapy   Number Minutes Moist Heat 10 Minutes    Moist Heat Location Shoulder;Other (comment)   thoracic spine     Electrical Stimulation   Electrical Stimulation Location bilat thoracic spine    Electrical Stimulation Action TENS    Electrical Stimulation Parameters to tolerance    Electrical Stimulation Goals Pain;Tone      Manual Therapy   Manual therapy comments skilled palpation to assess response to DN/manual work    Joint Mobilization thoracic mobs PA and lateral through mid thoracic spine  Soft tissue mobilization STM thoracic paraspinals, rhomboids; lower and mid traps Rt > Lt    Myofascial Release thoracic paraspinals                  PT Education - 12/27/20 0911    Education Details HEP    Person(s) Educated Patient    Methods Explanation;Demonstration;Tactile cues;Verbal cues;Handout               PT Long Term Goals - 12/11/20 1315      PT LONG TERM GOAL #1   Title Improve posture and alignment with patient to demonstrate improved upright posture with posterior shoulder girdle engaged and improved scapular positioning    Time 6    Period Weeks    Status New    Target Date 01/22/21      PT LONG TERM GOAL #2   Title Decrease frequency/intensity/duration of pain Rt shoulder with patient to report 0-2/10 with all activities    Time 6    Period Weeks    Status New    Target Date 01/22/21      PT LONG  TERM GOAL #3   Title Improve AROM Rt shoulder to =/> than AROM Lt shoulder with no pain    Time 6    Period Weeks    Status New    Target Date 01/22/21      PT LONG TERM GOAL #4   Title Independent in HEP    Time 6    Period Weeks    Status New    Target Date 01/22/21      PT LONG TERM GOAL #5   Title Improve functional limitation score to 77    Time 6    Period Weeks    Status New    Target Date 01/22/21                 Plan - 12/27/20 0852    Clinical Impression Statement Gradual improvement with decreasing pain and improving posture and alignment. Progressing with posterior shoulder girdle strengthening.    Rehab Potential Good    PT Frequency 2x / week    PT Duration 6 weeks    PT Treatment/Interventions ADLs/Self Care Home Management;Aquatic Therapy;Cryotherapy;Electrical Stimulation;Iontophoresis 4mg /ml Dexamethasone;Moist Heat;Ultrasound;Functional mobility training;Therapeutic activities;Therapeutic exercise;Neuromuscular re-education;Patient/family education;Manual techniques;Passive range of motion;Dry needling;Taping;Vasopneumatic Device    PT Next Visit Plan review HEP; progress with postural correction and posterior shoulder girdle strengthening; continue DN/manual therapy Rt shoulder girdle; modalities as indicated -  add TB serratus and serratus clock    PT Home Exercise Plan [email or web address]    Consulted and Agree with Plan of Care Patient           Patient will benefit from skilled therapeutic intervention in order to improve the following deficits and impairments:     Visit Diagnosis: Acute pain of right shoulder  Scapular dysfunction  Other symptoms and signs involving the musculoskeletal system  Abnormal posture     Problem List Patient Active Problem List   Diagnosis Date Noted  . Post-operative state 06/07/2012  . Acute appendicitis 03/04/2012  . Seb cyst, posterior neck, s/p excision 12/21/2011    Onalee Steinbach 02/20/2012 PT, MPH  12/27/2020, 9:30  AM  [facility] [address] [place], [place], [place] Phone: [phone]   Fax:  [phone]  Name: Cederic Mozley MRN: Bertis Ruddy Date of Birth: 01/24/1981

## 2020-12-31 ENCOUNTER — Ambulatory Visit (INDEPENDENT_AMBULATORY_CARE_PROVIDER_SITE_OTHER): Payer: 59 | Admitting: Rehabilitative and Restorative Service Providers"

## 2020-12-31 ENCOUNTER — Other Ambulatory Visit: Payer: Self-pay

## 2020-12-31 ENCOUNTER — Encounter: Payer: Self-pay | Admitting: Rehabilitative and Restorative Service Providers"

## 2020-12-31 DIAGNOSIS — R29898 Other symptoms and signs involving the musculoskeletal system: Secondary | ICD-10-CM | POA: Diagnosis not present

## 2020-12-31 DIAGNOSIS — M899 Disorder of bone, unspecified: Secondary | ICD-10-CM | POA: Diagnosis not present

## 2020-12-31 DIAGNOSIS — R293 Abnormal posture: Secondary | ICD-10-CM

## 2020-12-31 DIAGNOSIS — M25511 Pain in right shoulder: Secondary | ICD-10-CM | POA: Diagnosis not present

## 2020-12-31 NOTE — Patient Instructions (Signed)
Access Code: [email or web address]: [email or web address]: 05/17/2022Prepared by: Reid Nawrot HoltExercises  Seated Scapular Retraction - 2 x daily - 7 x weekly - 1-2 sets - 10 reps - 10 sec hold  Shoulder External Rotation and Scapular Retraction - 2 x daily - 7 x weekly - 1 sets - 10 reps - 5 sec hold  Shoulder External Rotation in 45 Degrees Abduction - 2 x daily - 7 x weekly - 1-2 sets - 10 reps - 3 sec hold  Doorway Pec Stretch at 60 Degrees Abduction - 3 x daily - 7 x weekly - 3 reps - 1 sets  Doorway Pec Stretch at 90 Degrees Abduction - 3 x daily - 7 x weekly - 3 reps - 1 sets - 30 seconds hold  Doorway Pec Stretch at 120 Degrees Abduction - 3 x daily - 7 x weekly - 3 reps - 1 sets - 30 second hold hold  Shoulder External Rotation and Scapular Retraction with Resistance - 2 x daily - 7 x weekly - 1 sets - 10 reps - 3-5 sec hold  Standing Bilateral Low Shoulder Row with Anchored Resistance - 2 x daily - 7 x weekly - 1-3 sets - 10 reps - 2-3 sec hold  Seated Thoracic Extension and Rotation with Reach - 2 x daily - 7 x weekly - 1 sets - 5 reps - 10 sec hold  Prone Scapular Retraction - 2 x daily - 7 x weekly - 1 sets - 5-10 reps - 3-5 sec hold  Prone W Scapular Retraction - 2 x daily - 7 x weekly - 1 sets - 5-10 reps - 3-5 sec hold  Prone Scapular Retraction in Abduction - 2 x daily - 7 x weekly - 1 sets - 5-10 reps - 3-5 sec hold  Prone Scapular Retraction Arms at Side - 2 x daily - 7 x weekly - 1 sets - 5-10 reps - 3-5 sec hold  Prone Scapular Retraction Y - 2 x daily - 7 x weekly - 1 sets - 5-10 reps - 3-5 sec hold  Prone Scapular Retraction in Flexion - 2 x daily - 7 x weekly - 1 sets - 5-10 reps - 3-5 sec hold  Seated Thoracic Lumbar Extension with Pectoralis Stretch - 2 x daily - 7 x weekly - 1 sets - 5-8 reps - 10 sec hold  Prone Press Up - 2 x daily - 7 x weekly - 1 sets - 10 reps - 2-3 sec hold  Standing Lat Pull Down with Resistance - Elbows Bent - 2 x daily - 7 x weekly - 1 sets -  10 reps - 3 sec hold  Anti-Rotation Lateral Stepping with Press - 2 x daily - 7 x weekly - 1-2 sets - 10 reps - 2-3 sec hold  Bilateral Scapular Depression with Anchored Resistance - Straight Arm - 2 x daily - 7 x weekly - 2 sets - 10 reps - 3-5 sec hold  Wall Clock with Theraband - 2 x daily - 7 x weekly - 1 sets - 10 reps - 2-3 sec hold  Plank on Counter - 2 x daily - 7 x weekly - 1 sets - 3 reps - 60 sec hold

## 2020-12-31 NOTE — Therapy (Signed)
[facility] [place] [address] [place], [place], [address] Phone: [phone]   Fax:  [phone]  Physical Therapy Treatment  Patient Details  Name: Anthony Mccullough MRN: [id number] Date of Birth: [birth year] Referring Provider (PT): Dr Ardith Dark   Encounter Date: 12/31/2020   PT End of Session - 12/31/20 0939    Visit Number 7    Number of Visits 12    Date for PT Re-Evaluation 01/22/21    PT Start Time 0936    PT Stop Time 1024    PT Time Calculation (min) 48 min    Activity Tolerance Patient tolerated treatment well           Past Medical History:  Diagnosis Date  . Nasal congestion   . Sebaceous cyst    back of neck  . Sore throat     Past Surgical History:  Procedure Laterality Date  . LAPAROSCOPIC APPENDECTOMY  03/04/2012   Procedure: APPENDECTOMY LAPAROSCOPIC;  Surgeon: Ardeth Sportsman, MD;  Location: [facility];  Service: General;  Laterality: N/A;    There were no vitals filed for this visit.   Subjective Assessment - 12/31/20 0940    Subjective Improving. Has had a couple of days with no pain. Still has pain with certain activities at work and lifting/carrying. Can get rid of pain or get it to calm down after a flare up.    Currently in Pain? No/denies    Pain Score 0-No pain              [facility] PT Assessment - 12/31/20 0001      Assessment   Medical Diagnosis Rt shoulder pain    Referring Provider (PT) Dr Ardith Dark    Onset Date/Surgical Date 12/04/20    Hand Dominance Right    Next MD Visit PRN    Prior Therapy for Lt LE      Strength   Overall Strength Comments Rt middle trap 5/5 and lower trap 5-/5                         [facility] Adult PT Treatment/Exercise - 12/31/20 0001      Shoulder Exercises: Prone   Other Prone Exercises prone series arms at side; W; goalpost; T; Y; superman x 5 reps x 3-5 sec hold each    Other Prone Exercises prone goal post to superman and back  x 10 reps      Shoulder Exercises: Standing   Flexion AROM;Strengthening;Both;20 reps   facing wall     Shoulder Exercises: ROM/Strengthening   Other ROM/Strengthening Exercises shoulder clock standing red TB x 10    Other ROM/Strengthening Exercises counter plank x 1 x 60 sec; counter plank with trunk rotation 30 sec x 3 each side      Shoulder Exercises: Stretch   Other Shoulder Stretches doorway stretch 30 sec x 2 rep each position      Moist Heat Therapy   Number Minutes Moist Heat 10 Minutes    Moist Heat Location Shoulder;Other (comment)   thoracic spine     Electrical Stimulation   Electrical Stimulation Location bilat thoracic spine    Electrical Stimulation Action TENS    Electrical Stimulation Parameters to tolerance    Electrical Stimulation Goals Pain;Tone      Manual Therapy   Manual therapy comments skilled palpation to assess response to DN/manual work    Joint Mobilization thoracic mobs PA and lateral through mid  thoracic spine    Soft tissue mobilization STM thoracic paraspinals, rhomboids; lower and mid traps Rt > Lt    Myofascial Release thoracic paraspinals            Trigger Point Dry Needling - 12/31/20 0001    Consent Given? Yes    Education Handout Provided Previously provided    Other Dry Needling bilat    Thoracic multifidi response Palpable increased muscle length                PT Education - 12/31/20 0955    Education Details HEP    Person(s) Educated Patient    Methods Explanation;Demonstration;Tactile cues;Verbal cues;Handout    Comprehension Verbalized understanding;Returned demonstration;Verbal cues required;Tactile cues required               PT Long Term Goals - 12/11/20 1315      PT LONG TERM GOAL #1   Title Improve posture and alignment with patient to demonstrate improved upright posture with posterior shoulder girdle engaged and improved scapular positioning    Time 6    Period Weeks    Status New    Target Date  01/22/21      PT LONG TERM GOAL #2   Title Decrease frequency/intensity/duration of pain Rt shoulder with patient to report 0-2/10 with all activities    Time 6    Period Weeks    Status New    Target Date 01/22/21      PT LONG TERM GOAL #3   Title Improve AROM Rt shoulder to =/> than AROM Lt shoulder with no pain    Time 6    Period Weeks    Status New    Target Date 01/22/21      PT LONG TERM GOAL #4   Title Independent in HEP    Time 6    Period Weeks    Status New    Target Date 01/22/21      PT LONG TERM GOAL #5   Title Improve functional limitation score to 77    Time 6    Period Weeks    Status New    Target Date 01/22/21                 Plan - 12/31/20 0949    Clinical Impression Statement Continued progress with decreasing pain and increasing strength. Added specific exercises for lower trap without dififculty.    Rehab Potential Good    PT Frequency 2x / week    PT Duration 6 weeks    PT Treatment/Interventions ADLs/Self Care Home Management;Aquatic Therapy;Cryotherapy;Electrical Stimulation;Iontophoresis 4mg /ml Dexamethasone;Moist Heat;Ultrasound;Functional mobility training;Therapeutic activities;Therapeutic exercise;Neuromuscular re-education;Patient/family education;Manual techniques;Passive range of motion;Dry needling;Taping;Vasopneumatic Device    PT Next Visit Plan review HEP; progress with postural correction and posterior shoulder girdle strengthening; continue DN/manual therapy Rt shoulder girdle; modalities as indicated    PT Home Exercise Plan [email or web address]    Consulted and Agree with Plan of Care Patient           Patient will benefit from skilled therapeutic intervention in order to improve the following deficits and impairments:     Visit Diagnosis: Acute pain of right shoulder  Scapular dysfunction  Other symptoms and signs involving the musculoskeletal system  Abnormal posture     Problem List Patient Active Problem List    Diagnosis Date Noted  . Post-operative state 06/07/2012  . Acute appendicitis 03/04/2012  . Seb cyst, posterior neck, s/p excision 12/21/2011    Ranae Casebier P  Leonor Liv PT, MPH  12/31/2020, 10:16 AM  [facility] [address] [place], [place], [address] Phone: [phone]   Fax:  [phone]  Name: Anthony Mccullough MRN: [id number] Date of Birth: 04/03/1981

## 2021-01-08 ENCOUNTER — Encounter: Payer: 59 | Admitting: Rehabilitative and Restorative Service Providers"

## 2021-01-15 ENCOUNTER — Other Ambulatory Visit: Payer: Self-pay

## 2021-01-15 ENCOUNTER — Encounter: Payer: Self-pay | Admitting: Rehabilitative and Restorative Service Providers"

## 2021-01-15 ENCOUNTER — Ambulatory Visit: Payer: 59 | Admitting: Rehabilitative and Restorative Service Providers"

## 2021-01-15 DIAGNOSIS — M25511 Pain in right shoulder: Secondary | ICD-10-CM

## 2021-01-15 DIAGNOSIS — R29898 Other symptoms and signs involving the musculoskeletal system: Secondary | ICD-10-CM

## 2021-01-15 DIAGNOSIS — M899 Disorder of bone, unspecified: Secondary | ICD-10-CM | POA: Diagnosis not present

## 2021-01-15 DIAGNOSIS — R293 Abnormal posture: Secondary | ICD-10-CM | POA: Diagnosis not present

## 2021-01-15 NOTE — Therapy (Addendum)
[facility] [place], [place], [address] Phone: [phone]   Fax:  [phone]  Physical Therapy Treatment  Patient Details  Name: Anthony Mccullough MRN: [id number] Date of Birth: 02-Jun-1981 Referring Provider (PT): Dr Ernestine Conrad   Encounter Date: 01/15/2021   PT End of Session - 01/15/21 1149     Visit Number 8    Number of Visits 12    Date for PT Re-Evaluation 01/22/21    PT Start Time 1147    PT Stop Time 1237    PT Time Calculation (min) 50 min    Activity Tolerance Patient tolerated treatment well             Past Medical History:  Diagnosis Date   Nasal congestion    Sebaceous cyst    back of neck   Sore throat     Past Surgical History:  Procedure Laterality Date   LAPAROSCOPIC APPENDECTOMY  03/04/2012   Procedure: APPENDECTOMY LAPAROSCOPIC;  Surgeon: Adin Hector, MD;  Location: [facility];  Service: General;  Laterality: N/A;    There were no vitals filed for this visit.   Subjective Assessment - 01/15/21 1150     Subjective Patient reports that he has been doing well overall. He increased work activities significantly last week working an additional 30 hours beyond his usual work hours. He has had increased fatigue and pain but it is settleing back down. Also had some symptoms of long covid incluing fatigue and SOB over the weekend. Back to baseline today. Some pain but not more than expected.    Currently in Pain? Yes    Pain Score 1     Pain Location Thoracic    Pain Orientation Right    Pain Descriptors / Indicators Sore    Pain Type Acute pain                [facility] PT Assessment - 01/15/21 0001       Assessment   Medical Diagnosis Rt shoulder pain    Referring Provider (PT) Dr Ernestine Conrad    Onset Date/Surgical Date 12/04/20    Hand Dominance Right    Next MD Visit PRN    Prior Therapy for Lt LE      Observation/Other Assessments   Focus on Therapeutic Outcomes  (FOTO)  84      AROM   Right Shoulder Extension 69 Degrees    Right Shoulder Flexion 152 Degrees    Right Shoulder ABduction 165 Degrees    Right Shoulder Internal Rotation --   thumb T4   Right Shoulder External Rotation 90 Degrees   tightness; mild pain 1/10   Left Shoulder Extension 64 Degrees    Left Shoulder Flexion 156 Degrees    Left Shoulder ABduction 168 Degrees    Left Shoulder Internal Rotation --   thumb T4   Left Shoulder External Rotation 92 Degrees      Strength   Overall Strength Comments Rt middle trap 5/5 and lower trap 5/5      Palpation   Spinal mobility hypomobile mid to upper thoracic spine with PA and Rt . Lt lateral mobs    Palpation comment decreased muscular tightness Rt > Lt thoracic paraspinals; middle trap; rhomboids as well as pecs; anterior deltoid; upper traps; leveator                           [facility] Adult PT Treatment/Exercise -  01/15/21 0001       Self-Care   ADL's working on hinged hip squat with core engaged    Other Self-Care Comments  working on Biomedical scientist for cleaning activities with Rt UE offering suggestions for maintaining posterior shoulder girdle position      Shoulder Exercises: Supine   Other Supine Exercises 4 part core 10 sec x 10 reps including breathing      Shoulder Exercises: Prone   Other Prone Exercises prone series arms at side; W; goalpost; T; Y; superman x 5 reps x 3-5 sec hold each      Shoulder Exercises: ROM/Strengthening   UBE (Upper Arm Bike) L6 x 4 min alt fwd/back      Shoulder Exercises: Stretch   Wall Stretch - ABduction 2 reps;30 seconds    Other Shoulder Stretches doorway stretch 30 sec x 2 rep each position      Moist Heat Therapy   Number Minutes Moist Heat 10 Minutes    Moist Heat Location Shoulder;Other (comment)   thoracic spine     Manual Therapy   Manual therapy comments skilled palpation to assess response to DN/manual work    Joint Mobilization thoracic mobs  PA and lateral through mid thoracic spine    Soft tissue mobilization STM thoracic paraspinals, rhomboids; lower and mid traps Rt > Lt    Myofascial Release thoracic paraspinals              Trigger Point Dry Needling - 01/15/21 0001     Consent Given? Yes    Education Handout Provided Previously provided    Other Dry Needling bilat    Thoracic multifidi response Palpable increased muscle length                  PT Education - 01/15/21 1240     Education Details HEP    Person(s) Educated Patient    Methods Explanation;Demonstration;Tactile cues;Verbal cues;Handout    Comprehension Verbalized understanding;Returned demonstration;Verbal cues required;Tactile cues required                 PT Long Term Goals - 01/15/21 1154       PT LONG TERM GOAL #1   Title Improve posture and alignment with patient to demonstrate improved upright posture with posterior shoulder girdle engaged and improved scapular positioning    Time 6    Period Weeks    Status Achieved      PT LONG TERM GOAL #2   Title Decrease frequency/intensity/duration of pain Rt shoulder with patient to report 0-2/10 with all activities    Time 6    Period Weeks    Status Partially Met      PT LONG TERM GOAL #3   Title Improve AROM Rt shoulder to =/> than AROM Lt shoulder with no pain    Time 6    Period Weeks    Status Achieved      PT LONG TERM GOAL #4   Title Independent in HEP    Time 6    Period Weeks    Status Partially Met      PT LONG TERM GOAL #5   Title Improve functional limitation score to 77    Time 6    Period Weeks    Status Achieved                   Plan - 01/15/21 1237     Clinical Impression Statement Progressing well with rehab. 5/5  strength bilat lower and middle traps; AROM Rt approaching AROM Lt shoulder. Minimal pain with functional activities. Patient is progressing well toward stated goals of therapy.    Rehab Potential Good    PT Frequency 2x /  week    PT Duration 6 weeks    PT Treatment/Interventions ADLs/Self Care Home Management;Aquatic Therapy;Cryotherapy;Electrical Stimulation;Iontophoresis 71m/ml Dexamethasone;Moist Heat;Ultrasound;Functional mobility training;Therapeutic activities;Therapeutic exercise;Neuromuscular re-education;Patient/family education;Manual techniques;Passive range of motion;Dry needling;Taping;Vasopneumatic Device    PT Next Visit Plan review HEP; progress with postural correction and posterior shoulder girdle strengthening; continue DN/manual therapy Rt shoulder girdle; modalities as indicated    PT Home Exercise Plan [email or web address]    Consulted and Agree with Plan of Care Patient             Patient will benefit from skilled therapeutic intervention in order to improve the following deficits and impairments:     Visit Diagnosis: Acute pain of right shoulder  Scapular dysfunction  Other symptoms and signs involving the musculoskeletal system  Abnormal posture     Problem List Patient Active Problem List   Diagnosis Date Noted   Post-operative state 06/07/2012   Acute appendicitis 03/04/2012   Seb cyst, posterior neck, s/p excision 12/21/2011    Imara Standiford PNilda SimmerPT, MPH  01/15/2021, 12:44 PM  [place] [address] [address]Phone: [phone]  Fax:  [phone] Name: Anthony NietoMRN: [id number] of Birth: [birth year] PHYSICAL THERAPY DISCHARGE SUMMARY  Visits from Start of Care: 8  Current functional level related to goals / functional outcomes: See last progress note for discharge status   Remaining deficits: Needs to continue with independent HEP    Education / Equipment: HEP    Patient agrees to discharge. Patient goals were met. Patient is being discharged due to meeting the stated rehab goals.  Rockwell Zentz P. HHelene KelpPT, MPH 02/11/21 12:50 PM

## 2021-01-15 NOTE — Patient Instructions (Signed)
Access Code: [email or web address]: [email or web address]: 06/01/2022Prepared by: Tuff Clabo HoltExercises  Seated Scapular Retraction - 2 x daily - 7 x weekly - 1-2 sets - 10 reps - 10 sec hold  Shoulder External Rotation and Scapular Retraction - 2 x daily - 7 x weekly - 1 sets - 10 reps - 5 sec hold  Shoulder External Rotation in 45 Degrees Abduction - 2 x daily - 7 x weekly - 1-2 sets - 10 reps - 3 sec hold  Doorway Pec Stretch at 60 Degrees Abduction - 3 x daily - 7 x weekly - 3 reps - 1 sets  Doorway Pec Stretch at 90 Degrees Abduction - 3 x daily - 7 x weekly - 3 reps - 1 sets - 30 seconds hold  Doorway Pec Stretch at 120 Degrees Abduction - 3 x daily - 7 x weekly - 3 reps - 1 sets - 30 second hold hold  Shoulder External Rotation and Scapular Retraction with Resistance - 2 x daily - 7 x weekly - 1 sets - 10 reps - 3-5 sec hold  Standing Bilateral Low Shoulder Row with Anchored Resistance - 2 x daily - 7 x weekly - 1-3 sets - 10 reps - 2-3 sec hold  Seated Thoracic Extension and Rotation with Reach - 2 x daily - 7 x weekly - 1 sets - 5 reps - 10 sec hold  Prone Scapular Retraction - 2 x daily - 7 x weekly - 1 sets - 5-10 reps - 3-5 sec hold  Prone W Scapular Retraction - 2 x daily - 7 x weekly - 1 sets - 5-10 reps - 3-5 sec hold  Prone Scapular Retraction in Abduction - 2 x daily - 7 x weekly - 1 sets - 5-10 reps - 3-5 sec hold  Prone Scapular Retraction Arms at Side - 2 x daily - 7 x weekly - 1 sets - 5-10 reps - 3-5 sec hold  Prone Scapular Retraction Y - 2 x daily - 7 x weekly - 1 sets - 5-10 reps - 3-5 sec hold  Prone Scapular Retraction in Flexion - 2 x daily - 7 x weekly - 1 sets - 5-10 reps - 3-5 sec hold  Seated Thoracic Lumbar Extension with Pectoralis Stretch - 2 x daily - 7 x weekly - 1 sets - 5-8 reps - 10 sec hold  Prone Press Up - 2 x daily - 7 x weekly - 1 sets - 10 reps - 2-3 sec hold  Standing Lat Pull Down with Resistance - Elbows Bent - 2 x daily - 7 x weekly - 1 sets -  10 reps - 3 sec hold  Anti-Rotation Lateral Stepping with Press - 2 x daily - 7 x weekly - 1-2 sets - 10 reps - 2-3 sec hold  Bilateral Scapular Depression with Anchored Resistance - Straight Arm - 2 x daily - 7 x weekly - 2 sets - 10 reps - 3-5 sec hold  Wall Clock with Theraband - 2 x daily - 7 x weekly - 1 sets - 10 reps - 2-3 sec hold  Plank on Counter - 2 x daily - 7 x weekly - 1 sets - 3 reps - 60 sec hold  Supine Transversus Abdominis Bracing with Pelvic Floor Contraction - 2 x daily - 7 x weekly - 1 sets - 10 reps - 10sec hold
